# Patient Record
Sex: Female | Born: 1955
Health system: Southern US, Community
[De-identification: ages and names within clinical notes are randomized; demographics above are authoritative.]

## PROBLEM LIST (undated history)

## (undated) DIAGNOSIS — K589 Irritable bowel syndrome without diarrhea: Secondary | ICD-10-CM

## (undated) DIAGNOSIS — K449 Diaphragmatic hernia without obstruction or gangrene: Secondary | ICD-10-CM

## (undated) DIAGNOSIS — Z9189 Other specified personal risk factors, not elsewhere classified: Secondary | ICD-10-CM

## (undated) DIAGNOSIS — C439 Malignant melanoma of skin, unspecified: Secondary | ICD-10-CM

## (undated) DIAGNOSIS — R3129 Other microscopic hematuria: Secondary | ICD-10-CM

## (undated) DIAGNOSIS — K5792 Diverticulitis of intestine, part unspecified, without perforation or abscess without bleeding: Secondary | ICD-10-CM

## (undated) DIAGNOSIS — K5790 Diverticulosis of intestine, part unspecified, without perforation or abscess without bleeding: Secondary | ICD-10-CM

## (undated) HISTORY — PX: TONSILLECTOMY AND ADENOIDECTOMY: SUR1326

## (undated) HISTORY — PX: MELANOMA EXCISION: SHX5266

## (undated) HISTORY — DX: Diverticulosis of intestine, part unspecified, without perforation or abscess without bleeding: K57.90

## (undated) HISTORY — DX: Other specified personal risk factors, not elsewhere classified: Z91.89

## (undated) HISTORY — DX: Diverticulitis of intestine, part unspecified, without perforation or abscess without bleeding: K57.92

## (undated) HISTORY — DX: Other microscopic hematuria: R31.29

## (undated) HISTORY — DX: Diaphragmatic hernia without obstruction or gangrene: K44.9

## (undated) HISTORY — DX: Irritable bowel syndrome without diarrhea: K58.9

## (undated) HISTORY — DX: Malignant melanoma of skin, unspecified: C43.9

---

## 1988-06-16 HISTORY — PX: TUBAL LIGATION: SHX77

## 1997-11-20 ENCOUNTER — Encounter: Payer: Self-pay | Admitting: Gastroenterology

## 1998-01-12 ENCOUNTER — Ambulatory Visit (HOSPITAL_COMMUNITY): Admission: RE | Admit: 1998-01-12 | Discharge: 1998-01-12 | Payer: Self-pay | Admitting: Gastroenterology

## 1998-12-28 ENCOUNTER — Ambulatory Visit (HOSPITAL_COMMUNITY): Admission: RE | Admit: 1998-12-28 | Discharge: 1998-12-28 | Payer: Self-pay | Admitting: Gynecology

## 1998-12-28 ENCOUNTER — Encounter (INDEPENDENT_AMBULATORY_CARE_PROVIDER_SITE_OTHER): Payer: Self-pay | Admitting: Specialist

## 1999-07-12 ENCOUNTER — Ambulatory Visit (HOSPITAL_COMMUNITY): Admission: RE | Admit: 1999-07-12 | Discharge: 1999-07-12 | Payer: Self-pay | Admitting: Gynecology

## 1999-07-12 ENCOUNTER — Encounter: Payer: Self-pay | Admitting: Gynecology

## 1999-09-25 ENCOUNTER — Other Ambulatory Visit: Admission: RE | Admit: 1999-09-25 | Discharge: 1999-09-25 | Payer: Self-pay | Admitting: Gynecology

## 1999-09-26 ENCOUNTER — Other Ambulatory Visit: Admission: RE | Admit: 1999-09-26 | Discharge: 1999-09-26 | Payer: Self-pay | Admitting: Gynecology

## 1999-09-26 ENCOUNTER — Encounter (INDEPENDENT_AMBULATORY_CARE_PROVIDER_SITE_OTHER): Payer: Self-pay

## 1999-10-07 ENCOUNTER — Encounter: Payer: Self-pay | Admitting: Gastroenterology

## 1999-11-25 ENCOUNTER — Encounter: Payer: Self-pay | Admitting: Gastroenterology

## 1999-11-25 ENCOUNTER — Ambulatory Visit (HOSPITAL_COMMUNITY): Admission: RE | Admit: 1999-11-25 | Discharge: 1999-11-25 | Payer: Self-pay | Admitting: Gastroenterology

## 1999-11-29 ENCOUNTER — Encounter: Payer: Self-pay | Admitting: Gastroenterology

## 2000-09-10 ENCOUNTER — Encounter: Payer: Self-pay | Admitting: Gastroenterology

## 2000-10-13 ENCOUNTER — Encounter: Payer: Self-pay | Admitting: Gynecology

## 2000-10-13 ENCOUNTER — Ambulatory Visit (HOSPITAL_COMMUNITY): Admission: RE | Admit: 2000-10-13 | Discharge: 2000-10-13 | Payer: Self-pay | Admitting: Gynecology

## 2000-12-01 ENCOUNTER — Encounter: Payer: Self-pay | Admitting: Gastroenterology

## 2001-01-06 ENCOUNTER — Other Ambulatory Visit: Admission: RE | Admit: 2001-01-06 | Discharge: 2001-01-06 | Payer: Self-pay | Admitting: Gynecology

## 2001-01-13 ENCOUNTER — Other Ambulatory Visit: Admission: RE | Admit: 2001-01-13 | Discharge: 2001-01-13 | Payer: Self-pay | Admitting: Gynecology

## 2001-07-01 ENCOUNTER — Encounter: Payer: Self-pay | Admitting: Gastroenterology

## 2002-08-04 ENCOUNTER — Other Ambulatory Visit: Admission: RE | Admit: 2002-08-04 | Discharge: 2002-08-04 | Payer: Self-pay | Admitting: Obstetrics and Gynecology

## 2003-03-09 ENCOUNTER — Ambulatory Visit (HOSPITAL_COMMUNITY): Admission: RE | Admit: 2003-03-09 | Discharge: 2003-03-09 | Payer: Self-pay | Admitting: Family Medicine

## 2003-03-09 ENCOUNTER — Encounter: Payer: Self-pay | Admitting: Family Medicine

## 2003-12-13 ENCOUNTER — Other Ambulatory Visit: Admission: RE | Admit: 2003-12-13 | Discharge: 2003-12-13 | Payer: Self-pay | Admitting: Obstetrics and Gynecology

## 2004-03-25 ENCOUNTER — Encounter: Payer: Self-pay | Admitting: Gastroenterology

## 2004-10-22 ENCOUNTER — Ambulatory Visit (HOSPITAL_COMMUNITY): Admission: RE | Admit: 2004-10-22 | Discharge: 2004-10-22 | Payer: Self-pay | Admitting: Obstetrics and Gynecology

## 2005-04-08 ENCOUNTER — Other Ambulatory Visit: Admission: RE | Admit: 2005-04-08 | Discharge: 2005-04-08 | Payer: Self-pay | Admitting: Obstetrics and Gynecology

## 2006-12-29 ENCOUNTER — Other Ambulatory Visit: Admission: RE | Admit: 2006-12-29 | Discharge: 2006-12-29 | Payer: Self-pay | Admitting: Obstetrics and Gynecology

## 2006-12-31 ENCOUNTER — Encounter: Admission: RE | Admit: 2006-12-31 | Discharge: 2006-12-31 | Payer: Self-pay | Admitting: Obstetrics and Gynecology

## 2007-01-21 ENCOUNTER — Encounter: Admission: RE | Admit: 2007-01-21 | Discharge: 2007-01-21 | Payer: Self-pay | Admitting: Endocrinology

## 2007-01-27 ENCOUNTER — Ambulatory Visit (HOSPITAL_COMMUNITY): Admission: RE | Admit: 2007-01-27 | Discharge: 2007-01-27 | Payer: Self-pay | Admitting: Obstetrics and Gynecology

## 2007-12-17 ENCOUNTER — Encounter: Payer: Self-pay | Admitting: Gastroenterology

## 2008-01-07 ENCOUNTER — Other Ambulatory Visit: Admission: RE | Admit: 2008-01-07 | Discharge: 2008-01-07 | Payer: Self-pay | Admitting: Obstetrics and Gynecology

## 2009-02-21 ENCOUNTER — Ambulatory Visit (HOSPITAL_COMMUNITY): Admission: RE | Admit: 2009-02-21 | Discharge: 2009-02-21 | Payer: Self-pay | Admitting: Obstetrics and Gynecology

## 2010-04-24 ENCOUNTER — Telehealth (INDEPENDENT_AMBULATORY_CARE_PROVIDER_SITE_OTHER): Payer: Self-pay | Admitting: *Deleted

## 2010-04-24 ENCOUNTER — Encounter (INDEPENDENT_AMBULATORY_CARE_PROVIDER_SITE_OTHER): Payer: Self-pay | Admitting: *Deleted

## 2010-05-30 ENCOUNTER — Encounter (INDEPENDENT_AMBULATORY_CARE_PROVIDER_SITE_OTHER): Payer: Self-pay | Admitting: *Deleted

## 2010-06-03 ENCOUNTER — Ambulatory Visit: Payer: Self-pay | Admitting: Gastroenterology

## 2010-06-18 ENCOUNTER — Telehealth: Payer: Self-pay | Admitting: Gastroenterology

## 2010-06-20 ENCOUNTER — Ambulatory Visit
Admission: RE | Admit: 2010-06-20 | Discharge: 2010-06-20 | Payer: Self-pay | Source: Home / Self Care | Attending: Gastroenterology | Admitting: Gastroenterology

## 2010-06-20 ENCOUNTER — Encounter: Payer: Self-pay | Admitting: Gastroenterology

## 2010-07-06 ENCOUNTER — Encounter: Payer: Self-pay | Admitting: Internal Medicine

## 2010-07-18 NOTE — Procedures (Signed)
Summary: Colonoscopy  Patient: Mary Bowers Note: All result statuses are Final unless otherwise noted.  Tests: (1) Colonoscopy (COL)   COL Colonoscopy           DONE     [facility]     [address]     [place], [place]  [address]           COLONOSCOPY PROCEDURE REPORT           PATIENT:  Mary, Bowers  MR#:  [id number]     BIRTHDATE:  07/30/1955, 54 yrs. old  GENDER:  female     ENDOSCOPIST:  Judie Petit T. Russella Dar, MD, [facility]           PROCEDURE DATE:  06/20/2010     PROCEDURE:  Colonoscopy 19147     ASA CLASS:  Class II     INDICATIONS:  1) surveillance and high-risk screening  2) family     history of colon cancer: father at 16 and sister at 60.     MEDICATIONS:   Fentanyl 100 mcg IV, Versed 10 mg IV     DESCRIPTION OF PROCEDURE:   After the risks benefits and     alternatives of the procedure were thoroughly explained, informed     consent was obtained.  Digital rectal exam was performed and     revealed no abnormalities.   The LB [id number]-[id number] endoscope     was introduced through the anus and advanced to the cecum, which     was identified by both the appendix and ileocecal valve, without     limitations.  The quality of the prep was excellent, using     MoviPrep.  The instrument was then slowly withdrawn as the colon     was fully examined.     <<PROCEDUREIMAGES>>     FINDINGS:  Moderate diverticulosis was found in the sigmoid to     descending colon.  A normal appearing cecum, ileocecal valve, and     appendiceal orifice were identified. The ascending, hepatic     flexure, transverse, splenic flexure, and rectum appeared     unremarkable. Retroflexed views in the rectum revealed no     abnormalities. The time to cecum =  4 minutes. The scope was then     withdrawn (time =  8  min) from the patient and the procedure     completed.           COMPLICATIONS:  None           ENDOSCOPIC IMPRESSION:     1) Moderate diverticulosis in the sigmoid to descending  colon           RECOMMENDATIONS:     1) High fiber diet with liberal fluid intake.     2) Repeat Colonoscopy in 5 years.           Venita Lick. Russella Dar, MD, [facility]           n.     eSIGNED:   Venita Lick. Minoru Chap at 06/20/2010 10:59 AM           Brickey, Okey Dupre, [id number]  Note: An exclamation mark (!) indicates a result that was not dispersed into the flowsheet. Document Creation Date: 06/20/2010 10:59 AM _______________________________________________________________________  (1) Order result status: Final Collection or observation date-time: 06/20/2010 10:54 Requested date-time:  Receipt date-time:  Reported date-time:  Referring Physician:   Ordering Physician: Claudette Head [phone]) Specimen Source:  Source: Launa Grill Order Number:  [id number] Lab site:   Appended Document: Colonoscopy    Clinical Lists Changes  Observations: Added new observation of COLONNXTDUE: 06/2015 (06/20/2010 12:08)

## 2010-07-18 NOTE — Progress Notes (Signed)
  Phone Note Other Incoming   Request: Send information Summary of Call: Patient completed a  medical release to received records from [facility] for her appt with Dr. Russella Dar. Release faxed to [phone] today.

## 2010-07-18 NOTE — Letter (Signed)
Summary: [facility]   Imported By: Sherian Rein 06/28/2010 09:55:33  _____________________________________________________________________  External Attachment:    Type:   Image     Comment:   External Document

## 2010-07-18 NOTE — Letter (Signed)
Summary: Pre Visit Letter Revised  [facility]  [address] [place], [place] [address]   Phone: [phone]  Fax: [phone]        04/24/2010 MRN: [id number]    Mary Bowers [address] [place], [place]  [address]             Procedure Date:  06/20/10   Welcome to the Gastroenterology Division at [facility].    You are scheduled to see a nurse for your pre-procedure visit on 06/03/09 at 9:00 a.m. on the 3rd floor at [facility], [address]  We ask that you try to arrive at our office 15 minutes prior to your appointment time to allow for check-in.  Please take a minute to review the attached form.  If you answer "Yes" to one or more of the questions on the first page, we ask that you call the person listed at your earliest opportunity.  If you answer "No" to all of the questions, please complete the rest of the form and bring it to your appointment.    Your nurse visit will consist of discussing your medical and surgical history, your immediate family medical history, and your medications.   If you are unable to list all of your medications on the form, please bring the medication bottles to your appointment and we will list them.  We will need to be aware of both prescribed and over the counter drugs.  We will need to know exact dosage information as well.    Please be prepared to read and sign documents such as consent forms, a financial agreement, and acknowledgement forms.  If necessary, and with your consent, a friend or relative is welcome to sit-in on the nurse visit with you.  Please bring your insurance card so that we may make a copy of it.  If your insurance requires a referral to see a specialist, please bring your referral form from your primary care physician.  No co-pay is required for this nurse visit.     If you cannot keep your appointment, please call [phone] to cancel or reschedule prior to your appointment date.  This  allows [place] the opportunity to schedule an appointment for another patient in need of care.    Thank you for choosing  Gastroenterology for your medical needs.  We appreciate the opportunity to care for you.  Please visit [place] at our website  to learn more about our practice.  Sincerely, The Gastroenterology Division

## 2010-07-18 NOTE — Letter (Signed)
Summary: [facility]   Imported By: Sherian Rein 06/28/2010 09:58:07  _____________________________________________________________________  External Attachment:    Type:   Image     Comment:   External Document

## 2010-07-18 NOTE — Letter (Signed)
Summary: [facility]   Imported By: Sherian Rein 06/28/2010 09:58:56  _____________________________________________________________________  External Attachment:    Type:   Image     Comment:   External Document

## 2010-07-18 NOTE — Letter (Signed)
Summary: [facility]   Imported By: Sherian Rein 06/28/2010 09:57:18  _____________________________________________________________________  External Attachment:    Type:   Image     Comment:   External Document

## 2010-07-18 NOTE — Letter (Signed)
Summary: Moviprep Instructions  [facility]  [address]   [place], [place] [address]   Phone: [phone]  Fax: [phone]       Mary Bowers    [birth year]    MRN: [id number]        Procedure Day Dorna Bloom: Thursday, 06-20-10     Arrival Time: 9:30 a.m.     Procedure Time: 10:30 a.m.     Location of Procedure:                    x   [facility] (4th Floor)   PREPARATION FOR COLONOSCOPY WITH MOVIPREP   Starting 5 days prior to your procedure 06-15-10 do not eat nuts, seeds, popcorn, corn, beans, peas,  salads, or any raw vegetables.  Do not take any fiber supplements (e.g. Metamucil, Citrucel, and Benefiber).  THE DAY BEFORE YOUR PROCEDURE         DATE: 06-19-10   DAY: Wednesday  1.  Drink clear liquids the entire day-NO SOLID FOOD  2.  Do not drink anything colored red or purple.  Avoid juices with pulp.  No orange juice.  3.  Drink at least 64 oz. (8 glasses) of fluid/clear liquids during the day to prevent dehydration and help the prep work efficiently.  CLEAR LIQUIDS INCLUDE: Water Jello Ice Popsicles Tea (sugar ok, no milk/cream) Powdered fruit flavored drinks Coffee (sugar ok, no milk/cream) Gatorade Juice: apple, white grape, white cranberry  Lemonade Clear bullion, consomm, broth Carbonated beverages (any kind) Strained chicken noodle soup Hard Candy                             4.  In the morning, mix first dose of MoviPrep solution:    Empty 1 Pouch A and 1 Pouch B into the disposable container    Add lukewarm drinking water to the top line of the container. Mix to dissolve    Refrigerate (mixed solution should be used within 24 hrs)  5.  Begin drinking the prep at 5:00 p.m. The MoviPrep container is divided by 4 marks.   Every 15 minutes drink the solution down to the next mark (approximately 8 oz) until the full liter is complete.   6.  Follow completed prep with 16 oz of clear liquid of your choice (Nothing red or  purple).  Continue to drink clear liquids until bedtime.  7.  Before going to bed, mix second dose of MoviPrep solution:    Empty 1 Pouch A and 1 Pouch B into the disposable container    Add lukewarm drinking water to the top line of the container. Mix to dissolve    Refrigerate  THE DAY OF YOUR PROCEDURE      DATE: 06-20-10  DAY: Thursday  Beginning at 5:30 a.m. (5 hours before procedure):         1. Every 15 minutes, drink the solution down to the next mark (approx 8 oz) until the full liter is complete.  2. Follow completed prep with 16 oz. of clear liquid of your choice.    3. You may drink clear liquids until  8:30 a.m.  (2 HOURS BEFORE PROCEDURE).   MEDICATION INSTRUCTIONS  Unless otherwise instructed, you should take regular prescription medications with a small sip of water   as early as possible the morning of your procedure.         OTHER INSTRUCTIONS  You will need a  responsible adult at least 57 years of age to accompany you and drive you home.   This person must remain in the waiting room during your procedure.  Wear loose fitting clothing that is easily removed.  Leave jewelry and other valuables at home.  However, you may wish to bring a book to read or  an iPod/MP3 player to listen to music as you wait for your procedure to start.  Remove all body piercing jewelry and leave at home.  Total time from sign-in until discharge is approximately 2-3 hours.  You should go home directly after your procedure and rest.  You can resume normal activities the  day after your procedure.  The day of your procedure you should not:   Drive   Make legal decisions   Operate machinery   Drink alcohol   Return to work  You will receive specific instructions about eating, activities and medications before you leave.    The above instructions have been reviewed and explained to me by   Ezra Sites RN  June 03, 2010 9:48 AM     I fully understand and can  verbalize these instructions _____________________________ Date _________

## 2010-07-18 NOTE — Progress Notes (Signed)
Summary: Records  Phone Note Call from Patient Call back at Home Phone [phone]   Caller: Patient Call For: Dr. Russella Dar Reason for Call: Talk to Nurse Summary of Call: Pt just spoke with doctor office about her records for colon and they are supposed to fax them, she would like [place] to call her tomorrow if we do not recieve her records Initial call taken by: [place] Johnson,  June 18, 2010 4:14 PM  Follow-up for Phone Call        Notified pt that we have not recieved the records yet. Pt states she called earlier today and they stated they were working on the records and will send them before the end of the day. Pt states she will call again at 4:00pm to find out the status. Told pt we will call again tomorrow and fax the release over again if we needed to. Pt agreed. Follow-up by: Christie Nottingham CMA Duncan Dull),  June 19, 2010 3:31 PM

## 2010-07-18 NOTE — Letter (Signed)
Summary: [facility]   Imported By: Sherian Rein 06/28/2010 10:00:00  _____________________________________________________________________  External Attachment:    Type:   Image     Comment:   External Document

## 2010-07-18 NOTE — Procedures (Signed)
Summary: Colonoscopy/[place]  Colonoscopy/[place]   Imported By: Sherian Rein 06/28/2010 09:50:16  _____________________________________________________________________  External Attachment:    Type:   Image     Comment:   External Document

## 2010-07-18 NOTE — Miscellaneous (Signed)
Summary: [facility]  Clinical Lists Changes  Medications: Added new medication of MOVIPREP 100 GM  SOLR (PEG-KCL-NACL-NASULF-NA ASC-C) As per prep instructions. - Signed Rx of MOVIPREP 100 GM  SOLR (PEG-KCL-NACL-NASULF-NA ASC-C) As per prep instructions.;  #1 x 0;  Signed;  Entered by: Ezra Sites RN;  Authorized by: Meryl Dare MD [facility];  Method used: Electronically to [facility]. [place]. [phone]*, [address] [place], [place], [place]  [address], Ph: [phone] or [phone], Fax: [phone] Observations: Added new observation of NKA: T (06/03/2010 8:57)    Prescriptions: MOVIPREP 100 GM  SOLR (PEG-KCL-NACL-NASULF-NA ASC-C) As per prep instructions.  #1 x 0   Entered by:   Ezra Sites RN   Authorized by:   Meryl Dare MD [facility]   Signed by:   Ezra Sites RN on 06/03/2010   Method used:   Electronically to        [facility]. [address] [phone]* (retail)       [address]       [place], [place]  [address]       Ph: [phone] or [phone]       Fax: [phone]   RxID:   [id number]   Appended Document: [facility] Pt in PV today. Pt  signed release for records from Dr. Kinnie Scales 11/9.  Have not received records.  Signed another release today and given to [facility] for followup.

## 2010-07-18 NOTE — Letter (Signed)
Summary: [facility]   Imported By: Sherian Rein 06/28/2010 09:56:34  _____________________________________________________________________  External Attachment:    Type:   Image     Comment:   External Document

## 2010-07-18 NOTE — Procedures (Signed)
Summary: Colonoscopy/[facility]  [facility]oscopy/[facility]   Imported By: Sherian Rein 06/28/2010 09:54:13  _____________________________________________________________________  External Attachment:    Type:   Image     Comment:   External Document

## 2010-11-20 ENCOUNTER — Other Ambulatory Visit: Payer: Self-pay | Admitting: Obstetrics and Gynecology

## 2010-11-20 DIAGNOSIS — Z1231 Encounter for screening mammogram for malignant neoplasm of breast: Secondary | ICD-10-CM

## 2010-12-03 ENCOUNTER — Ambulatory Visit (HOSPITAL_COMMUNITY)
Admission: RE | Admit: 2010-12-03 | Discharge: 2010-12-03 | Disposition: A | Discharge status: Home / Self Care | Payer: 59 | Source: Ambulatory Visit | Attending: Obstetrics and Gynecology | Admitting: Obstetrics and Gynecology

## 2010-12-03 DIAGNOSIS — Z1231 Encounter for screening mammogram for malignant neoplasm of breast: Secondary | ICD-10-CM

## 2011-01-14 ENCOUNTER — Emergency Department (HOSPITAL_COMMUNITY)
Admission: EM | Admit: 2011-01-14 | Discharge: 2011-01-14 | Disposition: A | Discharge status: Home / Self Care | Payer: Managed Care, Other (non HMO) | Attending: Emergency Medicine | Admitting: Emergency Medicine

## 2011-01-14 DIAGNOSIS — W010XXA Fall on same level from slipping, tripping and stumbling without subsequent striking against object, initial encounter: Secondary | ICD-10-CM | POA: Insufficient documentation

## 2011-01-14 DIAGNOSIS — S01501A Unspecified open wound of lip, initial encounter: Secondary | ICD-10-CM | POA: Insufficient documentation

## 2011-01-14 DIAGNOSIS — R040 Epistaxis: Secondary | ICD-10-CM | POA: Insufficient documentation

## 2011-01-14 DIAGNOSIS — S1093XA Contusion of unspecified part of neck, initial encounter: Secondary | ICD-10-CM | POA: Insufficient documentation

## 2011-01-14 DIAGNOSIS — S0003XA Contusion of scalp, initial encounter: Secondary | ICD-10-CM | POA: Insufficient documentation

## 2011-01-14 DIAGNOSIS — K089 Disorder of teeth and supporting structures, unspecified: Secondary | ICD-10-CM | POA: Insufficient documentation

## 2011-01-14 DIAGNOSIS — R51 Headache: Secondary | ICD-10-CM | POA: Insufficient documentation

## 2013-04-20 ENCOUNTER — Other Ambulatory Visit (HOSPITAL_COMMUNITY): Payer: Self-pay | Admitting: Internal Medicine

## 2013-04-20 DIAGNOSIS — Z1231 Encounter for screening mammogram for malignant neoplasm of breast: Secondary | ICD-10-CM

## 2013-05-05 ENCOUNTER — Ambulatory Visit (HOSPITAL_COMMUNITY)
Admission: RE | Admit: 2013-05-05 | Discharge: 2013-05-05 | Disposition: A | Discharge status: Home / Self Care | Payer: Commercial Managed Care - PPO | Source: Ambulatory Visit | Attending: Internal Medicine | Admitting: Internal Medicine

## 2013-05-05 DIAGNOSIS — Z1231 Encounter for screening mammogram for malignant neoplasm of breast: Secondary | ICD-10-CM

## 2013-05-10 ENCOUNTER — Other Ambulatory Visit: Payer: Self-pay | Admitting: Gynecology

## 2013-05-10 DIAGNOSIS — N644 Mastodynia: Secondary | ICD-10-CM

## 2013-05-11 ENCOUNTER — Encounter: Payer: Self-pay | Admitting: Obstetrics and Gynecology

## 2013-05-11 ENCOUNTER — Other Ambulatory Visit: Payer: Self-pay | Admitting: Gynecology

## 2013-05-11 DIAGNOSIS — N644 Mastodynia: Secondary | ICD-10-CM

## 2013-05-16 ENCOUNTER — Ambulatory Visit (INDEPENDENT_AMBULATORY_CARE_PROVIDER_SITE_OTHER): Payer: Commercial Managed Care - PPO | Admitting: Gynecology

## 2013-05-16 ENCOUNTER — Encounter: Payer: Self-pay | Admitting: Gynecology

## 2013-05-16 VITALS — BP 116/74 | HR 78 | Resp 18 | Ht 67.0 in | Wt 165.0 lb

## 2013-05-16 DIAGNOSIS — Z124 Encounter for screening for malignant neoplasm of cervix: Secondary | ICD-10-CM

## 2013-05-16 DIAGNOSIS — Z01419 Encounter for gynecological examination (general) (routine) without abnormal findings: Secondary | ICD-10-CM

## 2013-05-16 DIAGNOSIS — E041 Nontoxic single thyroid nodule: Secondary | ICD-10-CM | POA: Insufficient documentation

## 2013-05-16 DIAGNOSIS — R3129 Other microscopic hematuria: Secondary | ICD-10-CM | POA: Insufficient documentation

## 2013-05-16 DIAGNOSIS — Z9189 Other specified personal risk factors, not elsewhere classified: Secondary | ICD-10-CM | POA: Insufficient documentation

## 2013-05-16 NOTE — Patient Instructions (Signed)

## 2013-05-16 NOTE — Progress Notes (Signed)
57 y.o. Divorced Caucasian female   G2P2002 here for annual exam. Pt reports menses are absent.  She does not report hot flashes, does not have night sweats, does not have vaginal dryness.  She is not using lubricants.  She does not report post-menopasual bleeding. Pt reports history of DES exposure. Reports recurrent breast tenderness on left is getting diagnostic mammo next week, is having renal u/s for microscopic hematuria, and u/s for nodule on thyroid scheduled  Patient's last menstrual period was 06/16/2005.          Sexually active: no  The current method of family planning is post menopausal status.    Exercising: yes  tennis, gym 7x/wk Last pap: 03/01/10 Neg Abnormal PAP: no Mammogram: 12/04/10, Diagnostic scheduled for next week BSE: yes Colonoscopy: 2012 f/u in 5 years DEXA: none, scheduled for 11/2013 Alcohol: 4 glasses of wine/wk Tobacco: no  Hgb: PCP  Health Maintenance  Topic Date Due  . Colonoscopy  12/26/2003  . Mammogram  12/02/2012  . Influenza Vaccine  01/14/2013  . Pap Smear  03/01/2013  . Tetanus/tdap  06/17/2015    Family History  Problem Relation Age of Onset  . Hypertension Mother   . Colon cancer Father   . Hypertension Sister   . Hypertension Brother   . Breast cancer Maternal Aunt   . Cancer Maternal Grandmother   . Breast cancer Maternal Aunt     There are no active problems to display for this patient.   Past Medical History  Diagnosis Date  . IBS (irritable bowel syndrome)   . Microscopic hematuria 90's    w/u negative     Past Surgical History  Procedure Laterality Date  . Tonsillectomy and adenoidectomy    . Tubal ligation  1990    Allergies: Review of patient's allergies indicates no known allergies.  Current Outpatient Prescriptions  Medication Sig Dispense Refill  . hyoscyamine (LEVSIN, ANASPAZ) 0.125 MG tablet Take 0.125 mg by mouth as needed.      . IBUPROFEN PO Take by mouth as needed.      . Multiple Vitamins-Minerals  (MULTIVITAMIN PO) Take by mouth.       No current facility-administered medications for this visit.    ROS: Pertinent items are noted in HPI.  Exam:    BP 116/74  Pulse 78  Resp 18  Ht 5\' 7"  (1.702 m)  Wt 165 lb (74.844 kg)  BMI 25.84 kg/m2  LMP 06/16/2005 Weight change: @WEIGHTCHANGE @ Last 3 height recordings:  Ht Readings from Last 3 Encounters:  05/16/13 5\' 7"  (1.702 m)   General appearance: alert, cooperative and appears stated age Head: Normocephalic, without obvious abnormality, atraumatic Neck: no adenopathy, no carotid bruit, no JVD, supple, symmetrical, trachea midline and thyroid not enlarged, symmetric, no tenderness/mass/nodules Lungs: clear to auscultation bilaterally Breasts: normal appearance, no masses or tenderness Heart: regular rate and rhythm, S1, S2 normal, no murmur, click, rub or gallop Abdomen: soft, non-tender; bowel sounds normal; no masses,  no organomegaly Extremities: extremities normal, atraumatic, no cyanosis or edema Skin: Skin color, texture, turgor normal. No rashes or lesions Lymph nodes: Cervical, supraclavicular, and axillary nodes normal. no inguinal nodes palpated Neurologic: Grossly normal   Pelvic: External genitalia:  no lesions              Urethra: normal appearing urethra with no masses, tenderness or lesions              Bartholins and Skenes: normal  Vagina: normal appearing vagina with normal color and discharge, no lesions, rectocele noted              Cervix: cockscomb? anterior lip              Pap taken: yes        Bimanual Exam:  Uterus:  uterus is normal size, shape, consistency and nontender                                      Adnexa:    normal adnexa in size, nontender and no masses                                      Rectovaginal: Confirms                                      Anus:  normal sphincter tone, no lesions  A: well woman Mastalgia History of in utero DES Microscopic hematuria Thyroid  nodlue     P: mammogram diagnostic set  pap smear HPV today, regular PAP until 68 due to DES F/u with PCP regarding kidney and thyroid eval counseled on breast self exam, mammography screening, adequate intake of calcium and vitamin D, diet and exercise return annually or prn Discussed PAP guideline changes, importance of weight bearing exercises, calcium, vit D and balanced diet.  An After Visit Summary was printed and given to the patient.

## 2013-05-25 ENCOUNTER — Ambulatory Visit
Admission: RE | Admit: 2013-05-25 | Discharge: 2013-05-25 | Disposition: A | Discharge status: Home / Self Care | Payer: Commercial Managed Care - PPO | Source: Ambulatory Visit | Attending: Gynecology | Admitting: Gynecology

## 2013-05-25 DIAGNOSIS — N644 Mastodynia: Secondary | ICD-10-CM

## 2013-05-26 ENCOUNTER — Telehealth: Payer: Self-pay | Admitting: Emergency Medicine

## 2013-05-26 NOTE — Telephone Encounter (Signed)
Message copied by Joeseph Amor on Thu May 26, 2013 10:52 AM ------      Message from: Douglass Rivers      Created: Wed May 25, 2013 10:08 AM       Inform breast imaging was normal as was our exam, if she continues to have intermittent tenderness in breast, we can reassess in 63m.  If she decides that she can go into recall otherwise, no ------

## 2013-05-26 NOTE — Telephone Encounter (Signed)
Message left to return call to Mary Bowers at [phone].    

## 2013-05-26 NOTE — Telephone Encounter (Signed)
Message left to return call to Krisi Azua at [phone].    

## 2013-05-26 NOTE — Telephone Encounter (Signed)
Patient is returning a call to tracy.

## 2013-06-28 NOTE — Telephone Encounter (Signed)
Message left to return call to Jaeshawn Silvio at [phone].    

## 2013-07-05 NOTE — Telephone Encounter (Signed)
Spoke with pt to advise that her breast imaging was normal. Inquired about any remaining tenderness and rec to be seen in April if so. Pt is having problems with arthritis and is taking a lot of ibuprofen, but no real breast problems now. Advised pt to call back if she has any problems for F/U appt. Pt agreeable.

## 2013-07-11 NOTE — Telephone Encounter (Signed)
Routing to provider for final review. Patient agreeable to disposition. Will close encounter  Recall entered

## 2014-04-17 ENCOUNTER — Encounter: Payer: Self-pay | Admitting: Gynecology

## 2014-05-01 ENCOUNTER — Telehealth: Payer: Self-pay | Admitting: *Deleted

## 2014-05-01 NOTE — Telephone Encounter (Signed)
Patient in 08 recall Last Pap- 05/16/13 Neg but has a history of DES Exposure  No AEX scheduled.  Left Message To Call Back

## 2014-05-18 ENCOUNTER — Other Ambulatory Visit: Payer: Self-pay | Admitting: Internal Medicine

## 2014-05-18 DIAGNOSIS — R319 Hematuria, unspecified: Secondary | ICD-10-CM

## 2014-05-18 DIAGNOSIS — E041 Nontoxic single thyroid nodule: Secondary | ICD-10-CM

## 2014-06-06 NOTE — Telephone Encounter (Signed)
Called patient Mary Bowers states Mary Bowers received message of Dr. Charlies Constable no longer being at our practice and is scheduled with another provider at another practice.

## 2014-06-14 ENCOUNTER — Other Ambulatory Visit: Payer: Commercial Managed Care - PPO

## 2014-06-30 ENCOUNTER — Ambulatory Visit
Admission: RE | Admit: 2014-06-30 | Discharge: 2014-06-30 | Disposition: A | Discharge status: Home / Self Care | Payer: Commercial Managed Care - PPO | Source: Ambulatory Visit | Attending: Internal Medicine | Admitting: Internal Medicine

## 2014-06-30 DIAGNOSIS — E041 Nontoxic single thyroid nodule: Secondary | ICD-10-CM

## 2014-06-30 DIAGNOSIS — R319 Hematuria, unspecified: Secondary | ICD-10-CM

## 2014-07-05 NOTE — Telephone Encounter (Signed)
Pt has transferred care.  Please advise recall/letter.

## 2014-07-06 NOTE — Telephone Encounter (Signed)
Out of Pap recall.  Can close encounter.

## 2014-07-06 NOTE — Telephone Encounter (Signed)
Pt out of recall. Closing encounter.

## 2015-06-21 ENCOUNTER — Encounter: Payer: Self-pay | Admitting: Gastroenterology

## 2015-08-02 ENCOUNTER — Encounter: Payer: Self-pay | Admitting: Gastroenterology

## 2015-08-17 ENCOUNTER — Encounter: Payer: Commercial Managed Care - PPO | Admitting: Gastroenterology

## 2015-09-25 ENCOUNTER — Encounter: Payer: Self-pay | Admitting: Gastroenterology

## 2015-10-05 ENCOUNTER — Encounter: Payer: Commercial Managed Care - PPO | Admitting: Gastroenterology

## 2015-10-16 ENCOUNTER — Encounter: Payer: Self-pay | Admitting: Internal Medicine

## 2017-08-31 ENCOUNTER — Other Ambulatory Visit: Payer: Self-pay | Admitting: Obstetrics and Gynecology

## 2017-08-31 DIAGNOSIS — Z1231 Encounter for screening mammogram for malignant neoplasm of breast: Secondary | ICD-10-CM

## 2017-09-01 ENCOUNTER — Ambulatory Visit: Payer: Commercial Managed Care - PPO

## 2018-06-19 DIAGNOSIS — J019 Acute sinusitis, unspecified: Secondary | ICD-10-CM | POA: Diagnosis not present

## 2018-07-08 DIAGNOSIS — M5136 Other intervertebral disc degeneration, lumbar region: Secondary | ICD-10-CM | POA: Diagnosis not present

## 2018-07-08 DIAGNOSIS — M545 Low back pain: Secondary | ICD-10-CM | POA: Diagnosis not present

## 2018-07-12 DIAGNOSIS — M5416 Radiculopathy, lumbar region: Secondary | ICD-10-CM | POA: Insufficient documentation

## 2018-07-21 DIAGNOSIS — L821 Other seborrheic keratosis: Secondary | ICD-10-CM | POA: Diagnosis not present

## 2018-07-21 DIAGNOSIS — L814 Other melanin hyperpigmentation: Secondary | ICD-10-CM | POA: Diagnosis not present

## 2018-07-21 DIAGNOSIS — D225 Melanocytic nevi of trunk: Secondary | ICD-10-CM | POA: Diagnosis not present

## 2018-07-23 DIAGNOSIS — M5416 Radiculopathy, lumbar region: Secondary | ICD-10-CM | POA: Diagnosis not present

## 2018-08-11 DIAGNOSIS — M5416 Radiculopathy, lumbar region: Secondary | ICD-10-CM | POA: Diagnosis not present

## 2018-08-11 DIAGNOSIS — M5136 Other intervertebral disc degeneration, lumbar region: Secondary | ICD-10-CM | POA: Diagnosis not present

## 2018-08-16 ENCOUNTER — Other Ambulatory Visit: Payer: Self-pay | Admitting: Registered Nurse

## 2018-08-16 ENCOUNTER — Ambulatory Visit
Admission: RE | Admit: 2018-08-16 | Discharge: 2018-08-16 | Disposition: A | Discharge status: Home / Self Care | Payer: Commercial Managed Care - PPO | Source: Ambulatory Visit | Attending: Registered Nurse | Admitting: Registered Nurse

## 2018-08-16 ENCOUNTER — Other Ambulatory Visit: Payer: Self-pay | Admitting: Internal Medicine

## 2018-08-16 DIAGNOSIS — R1033 Periumbilical pain: Secondary | ICD-10-CM | POA: Diagnosis not present

## 2018-08-16 DIAGNOSIS — K449 Diaphragmatic hernia without obstruction or gangrene: Secondary | ICD-10-CM | POA: Diagnosis not present

## 2018-08-16 DIAGNOSIS — Z6826 Body mass index (BMI) 26.0-26.9, adult: Secondary | ICD-10-CM | POA: Diagnosis not present

## 2018-08-16 DIAGNOSIS — K5792 Diverticulitis of intestine, part unspecified, without perforation or abscess without bleeding: Secondary | ICD-10-CM | POA: Diagnosis not present

## 2018-08-16 DIAGNOSIS — N390 Urinary tract infection, site not specified: Secondary | ICD-10-CM | POA: Diagnosis not present

## 2018-08-16 DIAGNOSIS — K5730 Diverticulosis of large intestine without perforation or abscess without bleeding: Secondary | ICD-10-CM | POA: Diagnosis not present

## 2018-08-16 MED ORDER — IOPAMIDOL (ISOVUE-300) INJECTION 61%
100.0000 mL | Freq: Once | INTRAVENOUS | Status: AC | PRN
  Administered 2018-08-16: 100 mL via INTRAVENOUS

## 2018-10-26 DIAGNOSIS — H6691 Otitis media, unspecified, right ear: Secondary | ICD-10-CM | POA: Diagnosis not present

## 2018-10-26 DIAGNOSIS — H9311 Tinnitus, right ear: Secondary | ICD-10-CM | POA: Diagnosis not present

## 2018-10-26 DIAGNOSIS — H9201 Otalgia, right ear: Secondary | ICD-10-CM | POA: Diagnosis not present

## 2019-03-08 ENCOUNTER — Other Ambulatory Visit: Payer: Self-pay | Admitting: Obstetrics and Gynecology

## 2019-03-08 DIAGNOSIS — R928 Other abnormal and inconclusive findings on diagnostic imaging of breast: Secondary | ICD-10-CM

## 2019-03-15 ENCOUNTER — Ambulatory Visit
Admission: RE | Admit: 2019-03-15 | Discharge: 2019-03-15 | Disposition: A | Discharge status: Home / Self Care | Payer: 59 | Source: Ambulatory Visit | Attending: Obstetrics and Gynecology | Admitting: Obstetrics and Gynecology

## 2019-03-15 ENCOUNTER — Other Ambulatory Visit: Payer: Self-pay

## 2019-03-15 DIAGNOSIS — R928 Other abnormal and inconclusive findings on diagnostic imaging of breast: Secondary | ICD-10-CM

## 2019-03-16 ENCOUNTER — Other Ambulatory Visit: Payer: Self-pay

## 2019-03-16 DIAGNOSIS — Z20822 Contact with and (suspected) exposure to covid-19: Secondary | ICD-10-CM

## 2019-03-17 LAB — NOVEL CORONAVIRUS, NAA: SARS-CoV-2, NAA: NOT DETECTED

## 2019-03-23 ENCOUNTER — Other Ambulatory Visit: Payer: Self-pay

## 2019-03-23 DIAGNOSIS — Z20822 Contact with and (suspected) exposure to covid-19: Secondary | ICD-10-CM

## 2019-03-25 LAB — NOVEL CORONAVIRUS, NAA: SARS-CoV-2, NAA: NOT DETECTED

## 2019-07-15 ENCOUNTER — Ambulatory Visit: Payer: 59

## 2019-07-22 ENCOUNTER — Ambulatory Visit: Payer: 59 | Attending: Internal Medicine

## 2019-07-22 DIAGNOSIS — Z23 Encounter for immunization: Secondary | ICD-10-CM | POA: Insufficient documentation

## 2019-07-22 NOTE — Progress Notes (Signed)
   Covid-19 Vaccination Clinic  Name:  Mary Bowers    MRN: [id number] DOB: 1955-06-18  07/22/2019  Mary Bowers was observed post Covid-19 immunization for 15 minutes without incidence. She was provided with Vaccine Information Sheet and instruction to access the V-Safe system.   Mary Bowers was instructed to call 911 with any severe reactions post vaccine: Marland Kitchen Difficulty breathing  . Swelling of your face and throat  . A fast heartbeat  . A bad rash all over your body  . Dizziness and weakness    Immunizations Administered    Name Date Dose VIS Date Route   Pfizer COVID-19 Vaccine 07/22/2019 10:13 AM 0.3 mL 05/27/2019 Intramuscular   Manufacturer: [place]   Lot: [id number]   [place]: [id number]

## 2019-08-05 ENCOUNTER — Ambulatory Visit: Payer: 59

## 2019-08-16 ENCOUNTER — Ambulatory Visit: Payer: 59 | Attending: Internal Medicine

## 2019-08-16 DIAGNOSIS — Z23 Encounter for immunization: Secondary | ICD-10-CM

## 2019-08-16 NOTE — Progress Notes (Signed)
   Covid-19 Vaccination Clinic  Name:  Embry Linwood    MRN: [id number] DOB: March 22, 1956  08/16/2019  Ms. Belgarde was observed post Covid-19 immunization for 15 minutes without incident. She was provided with Vaccine Information Sheet and instruction to access the V-Safe system.   Ms. Ya was instructed to call 911 with any severe reactions post vaccine: Marland Kitchen Difficulty breathing  . Swelling of face and throat  . A fast heartbeat  . A bad rash all over body  . Dizziness and weakness   Immunizations Administered    Name Date Dose VIS Date Route   Pfizer COVID-19 Vaccine 08/16/2019 11:12 AM 0.3 mL 05/27/2019 Intramuscular   Manufacturer: [place]   Lot: [id number]   [place]: [id number]

## 2020-04-16 ENCOUNTER — Other Ambulatory Visit: Payer: Self-pay | Admitting: Internal Medicine

## 2020-04-16 DIAGNOSIS — E041 Nontoxic single thyroid nodule: Secondary | ICD-10-CM

## 2020-04-24 ENCOUNTER — Ambulatory Visit
Admission: RE | Admit: 2020-04-24 | Discharge: 2020-04-24 | Disposition: A | Discharge status: Home / Self Care | Payer: 59 | Source: Ambulatory Visit | Attending: Internal Medicine | Admitting: Internal Medicine

## 2020-04-24 DIAGNOSIS — E041 Nontoxic single thyroid nodule: Secondary | ICD-10-CM

## 2020-09-06 ENCOUNTER — Telehealth: Payer: Self-pay | Admitting: Gastroenterology

## 2020-09-06 NOTE — Telephone Encounter (Signed)
OK per patient preference.  

## 2020-09-06 NOTE — Telephone Encounter (Signed)
Hi Dr. Fuller Plan, this pt saw you back in 2012. She would like to switch to Dr. Tarri Glenn because she feels more comfortable seen a female physician and has also heard good thing about her. Are you ok with the switch? Thank you.

## 2020-09-09 NOTE — Telephone Encounter (Signed)
OK with me.  Thanks. 

## 2020-09-10 NOTE — Telephone Encounter (Signed)
Left msg to c/b

## 2020-10-17 NOTE — Progress Notes (Signed)
Referring Provider: Shon Baton, MD Primary Care Physician:  Shon Baton, MD  Reason for Consultation: Diverticulosis, acid reflux, family history of colon cancer   IMPRESSION:  Intermittent solid-food dysphagia over the last year    - ? GERD-related dysmotility, ring, web, stricture    - EGD recommended for further evaluation Frequent fecal accidents - ? Etiology    - no associated diarrhea or constipation Reflux with history of small hiatal hernia on CT    - intolerant to 2 prior medications prescribed by Dr. Virgina Jock    - incomplete treatment on famotidine 20 mg QHS    - increase famotidine to BID dosing    - EGD for further evaluation Diverticulosis on CT IBS - diagnosed in her 11s Family history of colon cancer (father at 28, sister at 80) Family history of colon polyps (brother) No polyps on prior colonoscopy x 4, last 2012  PLAN: - Increase Famotidine 20 mg to BID - EGD to evaluate reflux and dysphagia - Colonoscopy for colon cancer screening (high risk due to family history) - Consider genetic counseling given the family history  Please see the "Patient Instructions" section for addition details about the plan.  HPI: Mary Bowers is a 65 y.o. female referred by Dr. Virgina Jock for diverticulosis, acid reflux, and family history of colon cancer.  The history is obtained through the patient and review of her electronic health record. Previously followed by Drs. Medhoff and Fuller Plan, seen most recently in 2012 for colonoscopy. She carries a history of IBS previously treated with antispasmodics. She is a Company secretary at [employer].   Presents today with concerns regarding:  (1) Recurrent flares of intermittent LLQ pain that she attributes to diverticulitis/diverticulitis in the setting of IBS. Has required antibiotics in the past x 2. Now, when symptoms are starting she will adjust her diet to minimize a full blown episode. Episodes    (2) GERD. Developed a cough in  08/2018 at the time of a URI however the cough never improved. Concurrent indigestion that she was treating with Rolaids. She started Pepcid 20 mg QHS after not tolerating two prior medications. Some breakthrough symptoms still occur with fried foods, beer, and breads. Intermittent sensation that food sticks at the sternum. The food will ultimately pass if she slows down eating. No evidence for GI bleeding, iron deficiency anemia, anorexia, unexplained weight loss, odynophagia, persistent vomiting.  (3) Family history of colon cancer.  Father with colon cancer at age 49 and sister with colon cancer at age 39.  Brother with colon polyps. No other known family history of colon cancer or polyps. No family history of uterine/endometrial cancer, pancreatic cancer or gastric/stomach cancer.  (4) Fecal urgency with associated accidents where she had to go home to change close. Contents are liquid, solid, and air. Not always diarrhea. No constipation.  No incomplete evacuation. No manual assistance with defecation. No challenges with post-defecation hygiene. Occurring intermittently over the last several years. Somewhat improved since she started taking famotidine and not using as much Rolaids. No blood or mucous in the stool.  She specifically asked about food intolerance.   Prior abdominal imaging: CT of the abdomen and pelvis with contrast 10/18/7320 for periumbilical abdominal pain showed colonic diverticulosis without diverticulitis, aortic atherosclerosis, and a small hiatal hernia.  Prior endoscopic history: -Colonoscopy with Dr. Earlean Shawl 07/29/1989 was normal -Colonoscopy with Dr. Earlean Shawl 01/12/1998 for a family history of colon cancer was normal -Colonoscopy with Dr. Earlean Shawl 03/25/2004 showed moderate sigmoid diverticulosis -  Colonoscopy with Dr. Fuller Plan 06/20/2010 showed moderate left-sided diverticulosis.  Repeat colonoscopy recommended in 5 years.   Past Medical History:  Diagnosis Date  . DES exposure in  utero   . Diverticulitis   . Diverticulosis   . Hiatal hernia   . IBS (irritable bowel syndrome)   . Melanoma ([place])   . Microscopic hematuria 90's   w/u negative     Past Surgical History:  Procedure Laterality Date  . MELANOMA EXCISION    . TONSILLECTOMY AND ADENOIDECTOMY    . TUBAL LIGATION  1990    Current Outpatient Medications  Medication Sig Dispense Refill  . famotidine (PEPCID) 20 MG tablet Take 20 mg by mouth daily.    . hyoscyamine (LEVSIN, ANASPAZ) 0.125 MG tablet Take 0.125 mg by mouth as needed.    . IBUPROFEN PO Take by mouth as needed.    . loratadine (CLARITIN) 10 MG tablet Take 10 mg by mouth daily.    . Multiple Vitamins-Minerals (MULTIVITAMIN PO) Take by mouth.     No current facility-administered medications for this visit.    Allergies as of 10/18/2020  . (No Known Allergies)    Family History  Problem Relation Age of Onset  . Hypertension Mother   . Colon cancer Father   . Hypertension Sister   . Hypertension Brother   . Colon polyps Brother   . Colon polyps Brother   . Colon polyps Brother   . Diverticulosis Sister   . Colon cancer Sister   . Lung cancer Sister   . Colon polyps Sister   . Breast cancer Maternal Aunt   . Cancer Maternal Grandmother        unknown type  . Breast cancer Maternal Aunt   . Ovarian cancer Maternal Aunt   . Melanoma Maternal Uncle        mets to brain     Review of Systems: 12 system ROS is negative except as noted above.   Physical Exam: General:   Alert,  well-nourished, pleasant and cooperative in NAD Head:  Normocephalic and atraumatic. Eyes:  Sclera clear, no icterus.   Conjunctiva pink. Ears:  Normal auditory acuity. Nose:  No deformity, discharge,  or lesions. Mouth:  No deformity or lesions.   Neck:  Supple; no masses or thyromegaly. Lungs:  Clear throughout to auscultation.   No wheezes. Heart:  Regular rate and rhythm; no murmurs. Abdomen:  Soft,nontender, nondistended, normal bowel sounds,  no rebound or guarding. No hepatosplenomegaly.   Rectal:  Deferred  Msk:  Symmetrical. No boney deformities LAD: No inguinal or umbilical LAD Extremities:  No clubbing or edema. Neurologic:  Alert and  oriented x4;  grossly nonfocal Skin:  Intact without significant lesions or rashes. Psych:  Alert and cooperative. Normal mood and affect.   Kayshawn Ozburn L. Tarri Glenn, MD, MPH 10/18/2020, 9:39 AM

## 2020-10-18 ENCOUNTER — Ambulatory Visit: Payer: 59 | Admitting: Gastroenterology

## 2020-10-18 ENCOUNTER — Encounter: Payer: Self-pay | Admitting: Gastroenterology

## 2020-10-18 ENCOUNTER — Other Ambulatory Visit: Payer: Self-pay

## 2020-10-18 VITALS — BP 140/86 | HR 80 | Ht 66.54 in | Wt 172.0 lb

## 2020-10-18 DIAGNOSIS — R1319 Other dysphagia: Secondary | ICD-10-CM | POA: Diagnosis not present

## 2020-10-18 DIAGNOSIS — R1032 Left lower quadrant pain: Secondary | ICD-10-CM | POA: Diagnosis not present

## 2020-10-18 DIAGNOSIS — K219 Gastro-esophageal reflux disease without esophagitis: Secondary | ICD-10-CM | POA: Diagnosis not present

## 2020-10-18 DIAGNOSIS — R159 Full incontinence of feces: Secondary | ICD-10-CM

## 2020-10-18 DIAGNOSIS — R152 Fecal urgency: Secondary | ICD-10-CM

## 2020-10-18 MED ORDER — FAMOTIDINE 20 MG PO TABS: 20.0000 mg | ORAL_TABLET | Freq: Two times a day (BID) | ORAL | 3 refills | Status: DC

## 2020-10-18 NOTE — Addendum Note (Signed)
Addended by: Hardie Pulley, Yolander Goodie J on: 10/18/2020 10:20 AM   Modules accepted: Orders

## 2020-10-18 NOTE — Patient Instructions (Addendum)
It was a pleasure to meet you today. Based on our discussion, I am providing you with my recommendations below:  RECOMMENDATION(S):   To better evaluate your symptoms, I am recommending an Endoscopy and a Colonoscopy  I am recommending that we increase your Famotidine. Please refer to the instructions below:  PRESCRIPTION MEDICATION(S):   We have sent the following medication(s) to your pharmacy:  . Famotidine - please take 20mg  by mouth 2 times daily  NOTE: If your medication(s) requires a PRIOR AUTHORIZATION, we will receive notification from your pharmacy. Once received, the process to submit for approval may take up to 7-10 business days. You will be contacted about any denials we have received from your insurance company as well as alternatives recommended by your provider.  COLONOSCOPY AND ENDOSCOPY:   . You have been scheduled for an endoscopy and a colonoscopy. Please follow the written instructions given to you at your visit today.  PREP:   . Please pick up your prep supplies at the pharmacy within the next 1-3 days.  INHALERS:   . If you use inhalers (even only as needed), please bring them with you on the day of your procedure.  COLONOSCOPY TIPS:  . To reduce nausea and dehydration, stay well hydrated for 3-4 days prior to the exam.  . To prevent skin/hemorrhoid irritation - prior to wiping, put A&Dointment or vaseline on the toilet paper. Marland Kitchen Keep a towel or pad on the bed.  Marland Kitchen BEFORE STARTING YOUR PREP, drink  64oz of clear liquids in the morning. This will help to flush the colon and will ensure you are well hydrated!!!!  NOTE - This is in addition to the fluids required for to complete your prep. . Use of a flavored hard candy, such as grape Anise Salvo, can counteract some of the flavor of the prep and may prevent some nausea.   FOLLOW UP:  . After your procedure, you will receive a call from my office staff regarding my recommendation for follow  up.  BMI:  . If you are age 2 or older, your body mass index should be between 23-30. Your Body mass index is 27.32 kg/m. If this is out of the aforementioned range listed, please consider follow up with your Primary Care Provider.  Thank you for trusting me with your gastrointestinal care!    Thornton Park, MD, MPH

## 2021-01-03 ENCOUNTER — Telehealth: Payer: Self-pay | Admitting: Gastroenterology

## 2021-01-03 NOTE — Telephone Encounter (Signed)
Inbound call from patient she is currently having a diverticulitis flare-up and is wanting to know if she needs to be seen before her scheduled procedure on 01/09/21.  Please advise.

## 2021-01-03 NOTE — Telephone Encounter (Signed)
Spoke with pt and she states she started having LLQ abd pain yesterday and had temp 99.7 last night, did not sleep well due to the pain. Her PCP Has just sent in augmentin for her to take. Preocedures rescheduled to 02/14/21@1 :30pm. Pt aware of appt and updated instructions sent through mychart to pt.

## 2021-01-08 IMAGING — CT CT ABD-PELV W/ CM
2 of 5 series · 15 of 46 positions shown, 17 images · IV contrast (iopamidol)
Comparison: None.

CLINICAL DATA: 62-year-old female with history of irritable bowel
syndrome. Periumbilical pain for the past 2 days.

EXAM:
CT ABDOMEN AND PELVIS WITH CONTRAST
TECHNIQUE: Multidetector CT imaging of the abdomen and pelvis was performed
using the standard protocol following bolus administration of
intravenous contrast.
CONTRAST:  100mL EQKPE5-1JJ IOPAMIDOL (EQKPE5-1JJ) INJECTION 61%

[Series 2: abd pelvis 5.00 br40 s3 ax · axial · 0.78mm/px · z∈[+1179,+1584]mm · 12 of 93 slices shown, 14 images]
[im 6/93  soft-tissue]
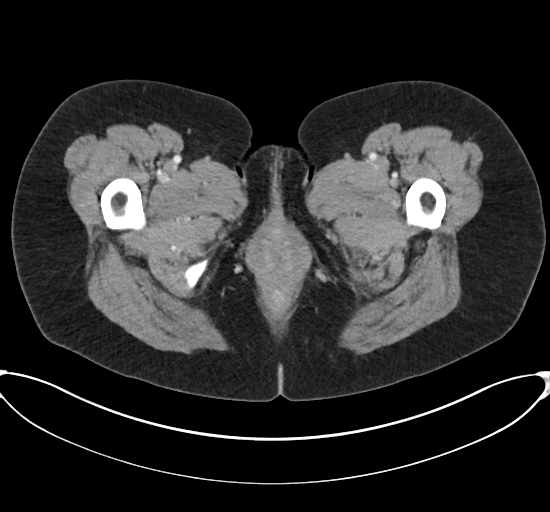
[im 6/93  bone]
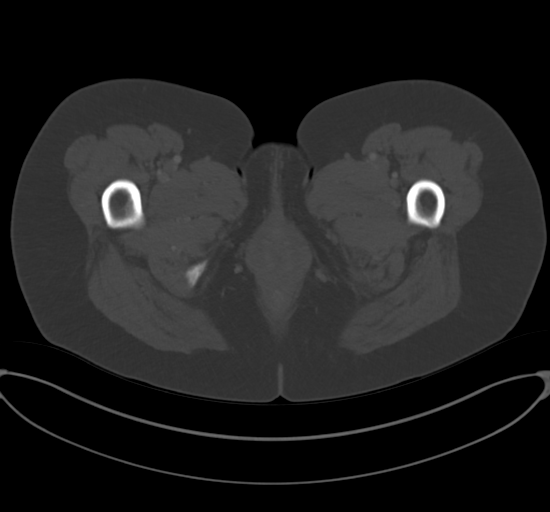
[im 16/93  soft-tissue]
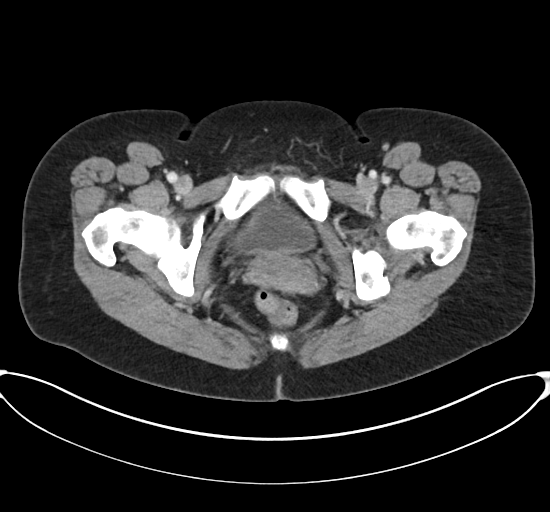
[im 21/93  soft-tissue]
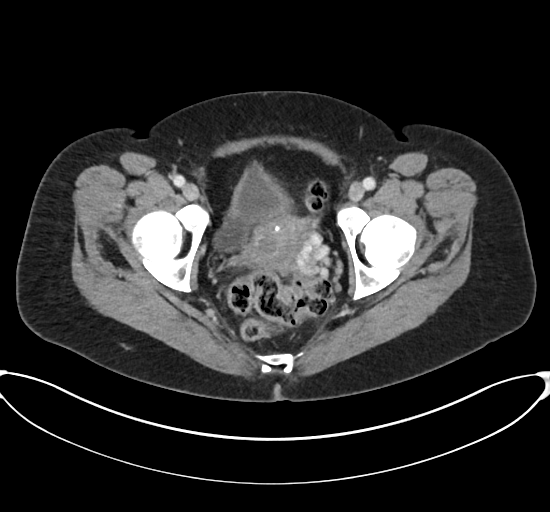
[im 26/93  soft-tissue]
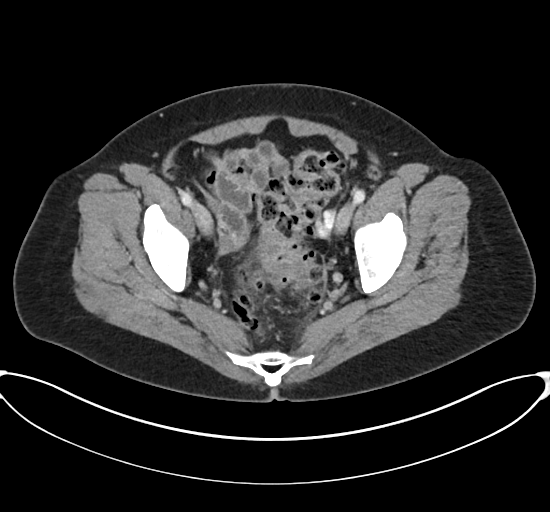
[im 36/93  soft-tissue]
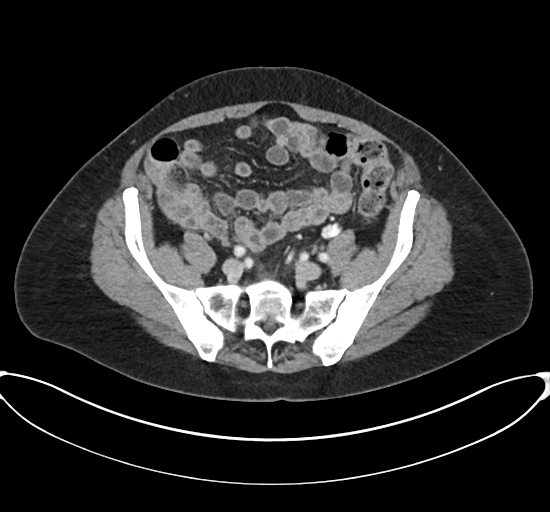
[im 41/93  soft-tissue]
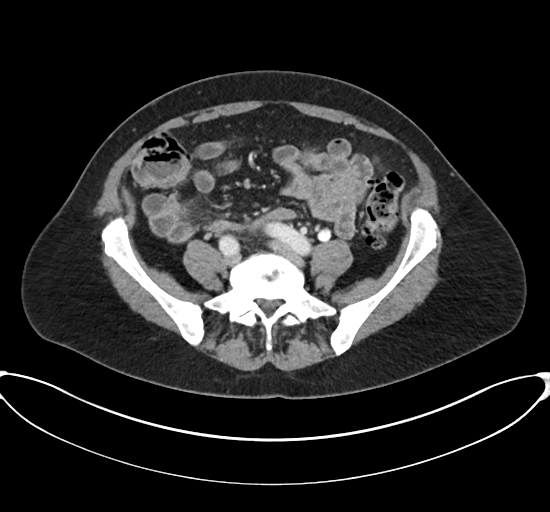
[im 52/93  soft-tissue]
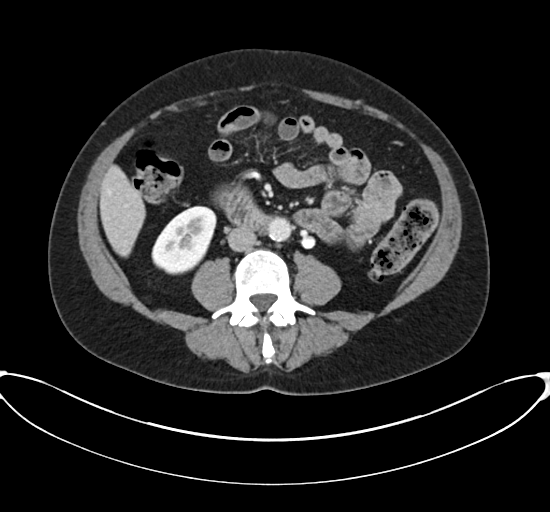
[im 57/93  soft-tissue]
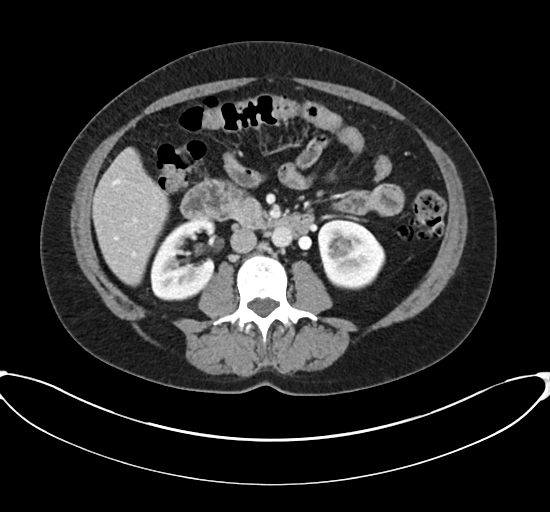
[im 67/93  soft-tissue]
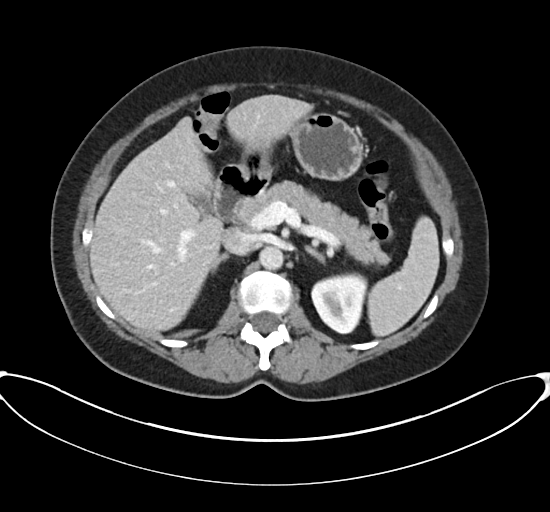
[im 67/93  bone]
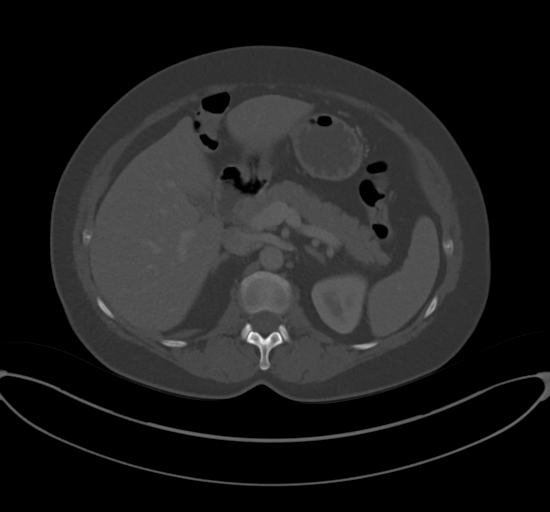
[im 72/93  soft-tissue]
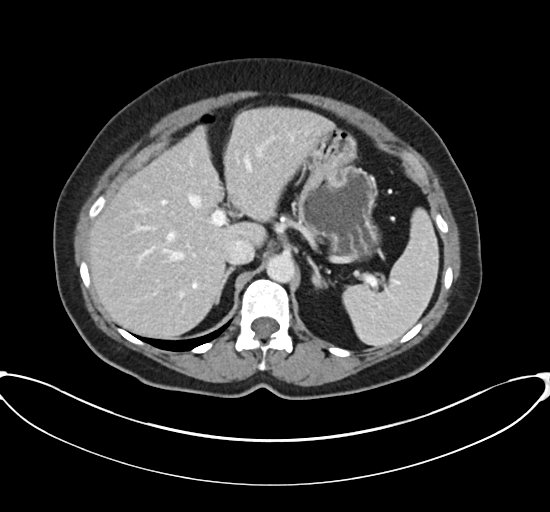
[im 77/93  soft-tissue]
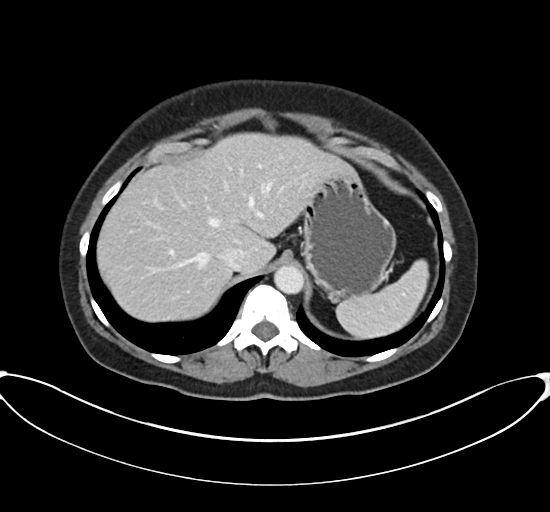
[im 87/93  soft-tissue]
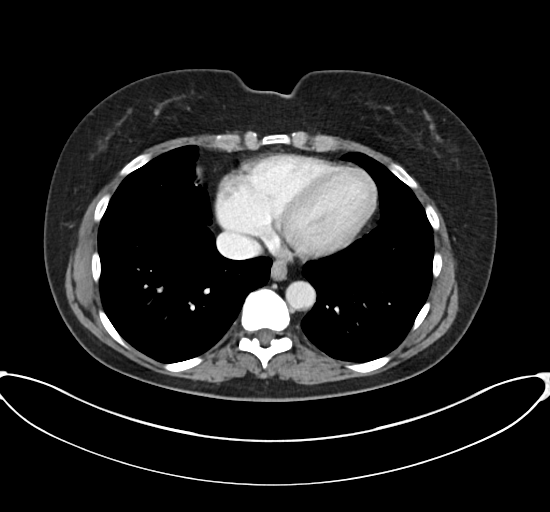

[Series 6: abd pelvis 2.00 br40 s3 cor · coronal · 0.82mm/px · 3 of 142 slices shown]
[im 48/142  soft-tissue]
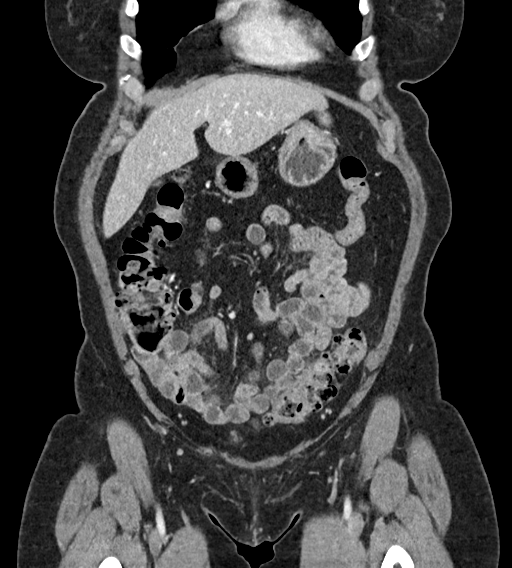
[im 63/142  soft-tissue]
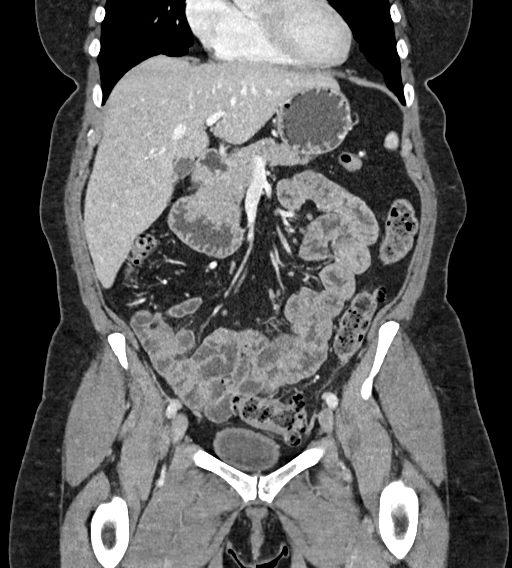
[im 79/142  soft-tissue]
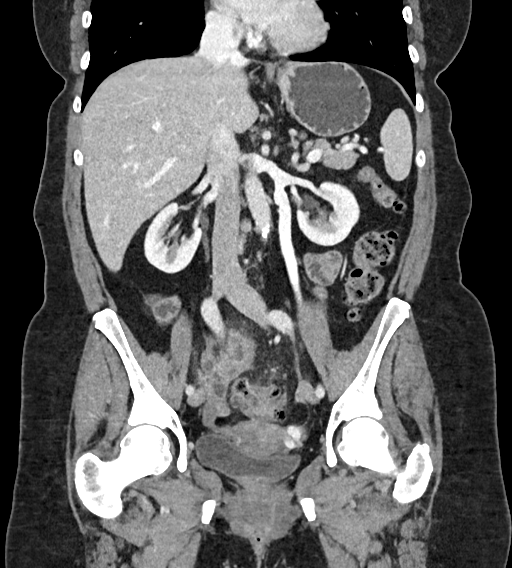

[15 of 46 positions shown; findings below may reference images not displayed]

FINDINGS: Lower chest: Small hiatal hernia.

Hepatobiliary: No suspicious cystic or solid hepatic lesions. No
intra or extrahepatic biliary ductal dilatation. Gallbladder is
normal in appearance.

Pancreas: No pancreatic mass. No pancreatic ductal dilatation. No
pancreatic or peripancreatic fluid or inflammatory changes.

Spleen: Unremarkable.

Adrenals/Urinary Tract: Bilateral kidneys and adrenal glands are
normal in appearance. No hydroureteronephrosis. Urinary bladder is
normal in appearance.

Stomach/Bowel: Normal appearance of the stomach. No pathologic
dilatation of small bowel or colon. Numerous colonic diverticulae
are noted. In the mid sigmoid colon there is extensive inflammatory
changes adjacent to a large diverticulum, compatible with acute
diverticulitis. Mild mural thickening throughout the adjacent colon,
likely related to acute inflammation. No discrete diverticular
abscess identified at this time. The appendix is not confidently
identified and may be surgically absent. Regardless, there are no
inflammatory changes noted adjacent to the cecum to suggest the
presence of an acute appendicitis at this time.

Vascular/Lymphatic: Aortic atherosclerosis, without evidence of
aneurysm or dissection in the abdominal or pelvic vasculature. No
lymphadenopathy noted in the abdomen or pelvis.

Reproductive: Uterus and ovaries are unremarkable in appearance.

Other: No significant volume of ascites.  No pneumoperitoneum.

Musculoskeletal: There are no aggressive appearing lytic or blastic
lesions noted in the visualized portions of the skeleton.
IMPRESSION: 1. Colonic diverticulosis, with acute diverticulitis of the mid
sigmoid colon. No discrete diverticular abscess or findings of frank
perforation are noted at this time.
2. Aortic atherosclerosis.
3. Small hiatal hernia.

These results will be called to the ordering clinician or
representative by the Radiologist Assistant, and communication
documented in the PACS or zVision Dashboard.

## 2021-01-09 ENCOUNTER — Encounter: Payer: 59 | Admitting: Gastroenterology

## 2021-02-15 ENCOUNTER — Other Ambulatory Visit: Payer: Self-pay

## 2021-02-15 ENCOUNTER — Ambulatory Visit (AMBULATORY_SURGERY_CENTER): Payer: 59 | Admitting: Gastroenterology

## 2021-02-15 ENCOUNTER — Encounter: Payer: Self-pay | Admitting: Gastroenterology

## 2021-02-15 VITALS — BP 151/69 | HR 86 | Temp 98.0°F | Resp 18 | Ht 66.0 in | Wt 172.0 lb

## 2021-02-15 DIAGNOSIS — Z1211 Encounter for screening for malignant neoplasm of colon: Secondary | ICD-10-CM

## 2021-02-15 DIAGNOSIS — Z800 Family history of malignant neoplasm of digestive organs: Secondary | ICD-10-CM

## 2021-02-15 DIAGNOSIS — R159 Full incontinence of feces: Secondary | ICD-10-CM

## 2021-02-15 DIAGNOSIS — K222 Esophageal obstruction: Secondary | ICD-10-CM

## 2021-02-15 DIAGNOSIS — R1319 Other dysphagia: Secondary | ICD-10-CM

## 2021-02-15 DIAGNOSIS — K219 Gastro-esophageal reflux disease without esophagitis: Secondary | ICD-10-CM | POA: Diagnosis not present

## 2021-02-15 DIAGNOSIS — K449 Diaphragmatic hernia without obstruction or gangrene: Secondary | ICD-10-CM | POA: Diagnosis not present

## 2021-02-15 MED ORDER — SODIUM CHLORIDE 0.9 % IV SOLN: 500.0000 mL | Freq: Once | INTRAVENOUS | Status: DC

## 2021-02-15 NOTE — Progress Notes (Signed)
Pt's states no medical or surgical changes since previsit or office visit. VS assessed by N.C ?

## 2021-02-15 NOTE — Progress Notes (Signed)
Called to room to assist during endoscopic procedure.  Patient ID and intended procedure confirmed with present staff. Received instructions for my participation in the procedure from the performing physician.  

## 2021-02-15 NOTE — Op Note (Signed)
[facility] Patient Name: Mary Bowers Procedure Date: 02/15/2021 1:18 PM MRN: [id number] Endoscopist: Thornton Park MD, MD Age: 65 Referring MD:  Date of Birth: 03-25-1956 Gender: Female Account #: [id number] Procedure:                Upper GI endoscopy Indications:              Dysphagia, Esophageal reflux symptoms that persist                            despite appropriate therapy Medicines:                Monitored Anesthesia Care Procedure:                Pre-Anesthesia Assessment:                           - Prior to the procedure, a History and Physical                            was performed, and patient medications and                            allergies were reviewed. The patient's tolerance of                            previous anesthesia was also reviewed. The risks                            and benefits of the procedure and the sedation                            options and risks were discussed with the patient.                            All questions were answered, and informed consent                            was obtained. Prior Anticoagulants: The patient has                            taken no previous anticoagulant or antiplatelet                            agents. ASA Grade Assessment: II - A patient with                            mild systemic disease. After reviewing the risks                            and benefits, the patient was deemed in                            satisfactory condition to undergo the procedure.  After obtaining informed consent, the endoscope was                            passed under direct vision. Throughout the                            procedure, the patient's blood pressure, pulse, and                            oxygen saturations were monitored continuously. The                            GIF [id number] was introduced through the                            mouth, and advanced to the  third part of duodenum.                            The upper GI endoscopy was accomplished without                            difficulty. The patient tolerated the procedure                            well. Scope In: Scope Out: Findings:                 A non-obstructing Schatzki ring was found in the                            lower third of the esophagus. A TTS dilator was                            passed through the scope. Dilation with a 16-17-18                            mm balloon dilator was performed to 18 mm. The                            dilation site was examined and showed mild mucosal                            disruption.                           A small hiatal hernia was present.                           The entire examined stomach was normal.                           The examined duodenum was normal. Complications:            No immediate complications. Estimated blood loss:  Minimal. Estimated Blood Loss:     Estimated blood loss was minimal. Impression:               - Non-obstructing Schatzki ring. Dilated.                           - Small hiatal hernia.                           - Normal stomach.                           - Normal examined duodenum.                           - No specimens collected. Recommendation:           - Patient has a contact number available for                            emergencies. The signs and symptoms of potential                            delayed complications were discussed with the                            patient. Return to normal activities tomorrow.                            Written discharge instructions were provided to the                            patient.                           - Clear liquid diet today. Advance diet tomorrow as                            tolerated.                           - Continue present medications. Increase Pepcid to                            20 mg twice  daily.                           - Consider repeat EGD with dilation if symptoms                            recur. Thornton Park MD, MD 02/15/2021 2:05:42 PM This report has been signed electronically.

## 2021-02-15 NOTE — Op Note (Signed)
[facility] Patient Name: Mary Bowers Procedure Date: 02/15/2021 1:17 PM MRN: [id number] Endoscopist: Thornton Park MD, MD Age: 65 Referring MD:  Date of Birth: 1955/08/16 Gender: Female Account #: [id number] Procedure:                Colonoscopy Indications:              Screening patient at increased risk: Family history                            of colorectal cancer in multiple 1st-degree                            relatives                           Family history of colon cancer (father at 50,                            sister at 34)                           Family history of colon polyps (brother)                           -Colonoscopy with Dr. Earlean Shawl 07/29/1989 was normal                           -Colonoscopy with Dr. Earlean Shawl 01/12/1998 for a family                            history of colon cancer was normal                           -Colonoscopy with Dr. Earlean Shawl 03/25/2004 showed                            moderate sigmoid diverticulosis                           -Colonoscopy with Dr. Fuller Plan 06/20/2010 showed                            moderate left-sided diverticulosis. Repeat                            colonoscopy recommended in 5 years. Medicines:                Monitored Anesthesia Care Procedure:                Pre-Anesthesia Assessment:                           - Prior to the procedure, a History and Physical                            was performed, and patient medications and  allergies were reviewed. The patient's tolerance of                            previous anesthesia was also reviewed. The risks                            and benefits of the procedure and the sedation                            options and risks were discussed with the patient.                            All questions were answered, and informed consent                            was obtained. Prior Anticoagulants: The patient has                             taken no previous anticoagulant or antiplatelet                            agents. ASA Grade Assessment: II - A patient with                            mild systemic disease. After reviewing the risks                            and benefits, the patient was deemed in                            satisfactory condition to undergo the procedure.                           After obtaining informed consent, the colonoscope                            was passed under direct vision. Throughout the                            procedure, the patient's blood pressure, pulse, and                            oxygen saturations were monitored continuously. The                            Colonoscope was introduced through the anus and                            advanced to the 3 cm into the ileum. A second                            forward view of the right colon was performed. The  colonoscopy was performed without difficulty. The                            patient tolerated the procedure well. The quality                            of the bowel preparation was good. The terminal                            ileum, ileocecal valve, appendiceal orifice, and                            rectum were photographed. Scope In: 1:40:08 PM Scope Out: 1:54:36 PM Scope Withdrawal Time: 0 hours 9 minutes 56 seconds  Total Procedure Duration: 0 hours 14 minutes 28 seconds  Findings:                 The perianal and digital rectal examinations were                            normal.                           Multiple small and large-mouthed diverticula were                            found in the entire colon, however, they were most                            dense in the sigmoid colon.                           The exam was otherwise without abnormality on                            direct and retroflexion views. Complications:            No immediate complications. Estimated Blood Loss:      Estimated blood loss: none. Impression:               - Diverticulosis in the entire examined colon.                           - The examination was otherwise normal on direct                            and retroflexion views.                           - No specimens collected. Recommendation:           - Patient has a contact number available for                            emergencies. The signs and symptoms of potential  delayed complications were discussed with the                            patient. Return to normal activities tomorrow.                            Written discharge instructions were provided to the                            patient.                           - Follow a high fiber diet. Drink at least 64                            ounces of water daily. Add a daily stool bulking                            agent such as psyllium (an exampled would be                            Metamucil).                           - Continue present medications.                           - Repeat colonoscopy in 5 years for surveillance                            given the family history of colon cancer and polyps.                           - Emerging evidence supports eating a diet of                            fruits, vegetables, grains, calcium, and yogurt                            while reducing red meat and alcohol may reduce the                            risk of colon cancer.                           - Thank you for allowing me to be involved in your                            colon cancer prevention. Thornton Park MD, MD 02/15/2021 2:09:12 PM This report has been signed electronically.

## 2021-02-15 NOTE — Progress Notes (Signed)
A and O x3. Report to RN. Tolerated MAC anesthesia well. 

## 2021-02-15 NOTE — Patient Instructions (Signed)
Thank you for allowing [place] to care for you today! Please follow clear liquid diet for the rest of today, advance to soft foods tomorrow, then regular if well tolerated. Continue present medications.  Increase Pepcid to 20 mg TWICE daily. Recommend transitioning to a high fiber diet and drink at least 64 ounces of water per day.  Add a stool bulking agent such as Metamucil or Benefiber. Recommend another colonoscopy in 5 years given family history.  YOU HAD AN ENDOSCOPIC PROCEDURE TODAY AT [facility]:   Refer to the procedure report that was given to you for any specific questions about what was found during the examination.  If the procedure report does not answer your questions, please call your gastroenterologist to clarify.  If you requested that your care partner not be given the details of your procedure findings, then the procedure report has been included in a sealed envelope for you to review at your convenience later.  YOU SHOULD EXPECT: Some feelings of bloating in the abdomen. Passage of more gas than usual.  Walking can help get rid of the air that was put into your GI tract during the procedure and reduce the bloating. If you had a lower endoscopy (such as a colonoscopy or flexible sigmoidoscopy) you may notice spotting of blood in your stool or on the toilet paper. If you underwent a bowel prep for your procedure, you may not have a normal bowel movement for a few days.  Please Note:  You might notice some irritation and congestion in your nose or some drainage.  This is from the oxygen used during your procedure.  There is no need for concern and it should clear up in a day or so.  SYMPTOMS TO REPORT IMMEDIATELY:  Following lower endoscopy (colonoscopy or flexible sigmoidoscopy):  Excessive amounts of blood in the stool  Significant tenderness or worsening of abdominal pains  Swelling of the abdomen that is new, acute  Fever of 100F or higher  Following upper  endoscopy (EGD)  Vomiting of blood or coffee ground material  New chest pain or pain under the shoulder blades  Painful or persistently difficult swallowing  New shortness of breath  Fever of 100F or higher  Black, tarry-looking stools  For urgent or emergent issues, a gastroenterologist can be reached at any hour by calling [phone]. Do not use MyChart messaging for urgent concerns.    DIET:  We do recommend a small meal at first, but then you may proceed to your regular diet.  Drink plenty of fluids but you should avoid alcoholic beverages for 24 hours.  ACTIVITY:  You should plan to take it easy for the rest of today and you should NOT DRIVE or use heavy machinery until tomorrow (because of the sedation medicines used during the test).    FOLLOW UP: Our staff will call the number listed on your records 48-72 hours following your procedure to check on you and address any questions or concerns that you may have regarding the information given to you following your procedure. If we do not reach you, we will leave a message.  We will attempt to reach you two times.  During this call, we will ask if you have developed any symptoms of COVID 19. If you develop any symptoms (ie: fever, flu-like symptoms, shortness of breath, cough etc.) before then, please call [phone].  If you test positive for Covid 19 in the 2 weeks post procedure, please call and report this  information to [place].    If any biopsies were taken you will be contacted by phone or by letter within the next 1-3 weeks.  Please call us at [phone] if you have not heard about the biopsies in 3 weeks.    SIGNATURES/CONFIDENTIALITY: You and/or your care partner have signed paperwork which will be entered into your electronic medical record.  These signatures attest to the fact that that the information above on your After Visit Summary has been reviewed and is understood.  Full responsibility of the confidentiality of this  discharge information lies with you and/or your care-partner.

## 2021-02-15 NOTE — Progress Notes (Signed)
Referring Provider: Shon Baton, MD Primary Care Physician:  Shon Baton, MD  Reason for Procedure: Diverticulosis, acid reflux, family history of colon cancer   IMPRESSION:  Intermittent solid-food dysphagia over the last year    - ? GERD-related dysmotility, ring, web, stricture    - EGD recommended for further evaluation Frequent fecal accidents - ? Etiology    - no associated diarrhea or constipation Reflux with history of small hiatal hernia on CT    - intolerant to 2 prior medications prescribed by Dr. Virgina Jock    - incomplete treatment on famotidine 20 mg QHS    - increase famotidine to BID dosing    - EGD for further evaluation Diverticulosis on CT IBS - diagnosed in her 17s Family history of colon cancer (father at 41, sister at 69) Family history of colon polyps (brother) No polyps on prior colonoscopy x 4, last 2012  PLAN: - EGD to evaluate reflux and dysphagia - Colonoscopy for colon cancer screening (high risk due to family history)  HPI: Mary Bowers is a 65 y.o. female referred by Dr. Virgina Jock for diverticulosis, acid reflux, and family history of colon cancer.  The history is obtained through the patient and review of her electronic health record. Previously followed by Drs. Medhoff and Fuller Plan, seen most recently in 2012 for colonoscopy. She carries a history of IBS previously treated with antispasmodics. She is a Company secretary at [employer].   Recent concerns:  (1) Recurrent flares of intermittent LLQ pain that she attributes to diverticulitis/diverticulitis in the setting of IBS. Has required antibiotics in the past x 2. Now, when symptoms are starting she will adjust her diet to minimize a full blown episode. Episodes    (2) GERD. Developed a cough in 08/2018 at the time of a URI however the cough never improved. Concurrent indigestion that she was treating with Rolaids. She started Pepcid 20 mg QHS after not tolerating two prior medications. Some  breakthrough symptoms still occur with fried foods, beer, and breads. Intermittent sensation that food sticks at the sternum. The food will ultimately pass if she slows down eating. No evidence for GI bleeding, iron deficiency anemia, anorexia, unexplained weight loss, odynophagia, persistent vomiting.  (3) Family history of colon cancer.  Father with colon cancer at age 63 and sister with colon cancer at age 75.  Brother with colon polyps. No other known family history of colon cancer or polyps. No family history of uterine/endometrial cancer, pancreatic cancer or gastric/stomach cancer.  (4) Fecal urgency with associated accidents where she had to go home to change close. Contents are liquid, solid, and air. Not always diarrhea. No constipation.  No incomplete evacuation. No manual assistance with defecation. No challenges with post-defecation hygiene. Occurring intermittently over the last several years. Somewhat improved since she started taking famotidine and not using as much Rolaids. No blood or mucous in the stool.  She specifically asked about food intolerance.   Prior abdominal imaging: CT of the abdomen and pelvis with contrast [id number] for periumbilical abdominal pain showed colonic diverticulosis without diverticulitis, aortic atherosclerosis, and a small hiatal hernia.  Prior endoscopic history: -Colonoscopy with Dr. Earlean Shawl 07/29/1989 was normal -Colonoscopy with Dr. Earlean Shawl 01/12/1998 for a family history of colon cancer was normal -Colonoscopy with Dr. Earlean Shawl 03/25/2004 showed moderate sigmoid diverticulosis -Colonoscopy with Dr. Fuller Plan 06/20/2010 showed moderate left-sided diverticulosis.  Repeat colonoscopy recommended in 5 years.   Past Medical History:  Diagnosis Date   DES exposure in utero  Diverticulitis    Diverticulosis    Hiatal hernia    IBS (irritable bowel syndrome)    Melanoma (HCC)    Microscopic hematuria 90's   w/u negative     Past Surgical History:   Procedure Laterality Date   MELANOMA EXCISION     TONSILLECTOMY AND ADENOIDECTOMY     TUBAL LIGATION  1990    Current Outpatient Medications  Medication Sig Dispense Refill   famotidine (PEPCID) 20 MG tablet Take 1 tablet (20 mg total) by mouth 2 (two) times daily. 60 tablet 3   IBUPROFEN PO Take by mouth as needed.     hyoscyamine (LEVSIN, ANASPAZ) 0.125 MG tablet Take 0.125 mg by mouth as needed. (Patient not taking: Reported on 02/15/2021)     loratadine (CLARITIN) 10 MG tablet Take 10 mg by mouth daily. (Patient not taking: Reported on 02/15/2021)     Multiple Vitamins-Minerals (MULTIVITAMIN PO) Take by mouth. (Patient not taking: Reported on 02/15/2021)     Current Facility-Administered Medications  Medication Dose Route Frequency Provider Last Rate Last Admin   0.9 %  sodium chloride infusion  500 mL Intravenous Once Thornton Park, MD        Allergies as of 02/15/2021   (No Known Allergies)    Family History  Problem Relation Age of Onset   Hypertension Mother    Colon cancer Father    Hypertension Sister    Hypertension Brother    Colon polyps Brother    Colon polyps Brother    Colon polyps Brother    Diverticulosis Sister    Colon cancer Sister    Lung cancer Sister    Colon polyps Sister    Breast cancer Maternal Aunt    Cancer Maternal Grandmother        unknown type   Breast cancer Maternal Aunt    Ovarian cancer Maternal Aunt    Melanoma Maternal Uncle        mets to brain     Physical Exam: General:   Alert,  well-nourished, pleasant and cooperative in NAD Head:  Normocephalic and atraumatic. Eyes:  Sclera clear, no icterus.   Conjunctiva pink. Ears:  Normal auditory acuity. Nose:  No deformity, discharge,  or lesions. Mouth:  No deformity or lesions.   Neck:  Supple; no masses or thyromegaly. Lungs:  Clear throughout to auscultation.   No wheezes. Heart:  Regular rate and rhythm; no murmurs. Abdomen:  Soft,nontender, nondistended, normal bowel  sounds, no rebound or guarding. No hepatosplenomegaly.   Rectal:  Deferred  Msk:  Symmetrical. No boney deformities LAD: No inguinal or umbilical LAD Extremities:  No clubbing or edema. Neurologic:  Alert and  oriented x4;  grossly nonfocal Skin:  Intact without significant lesions or rashes. Psych:  Alert and cooperative. Normal mood and affect.   Jakhi Dishman L. Tarri Glenn, MD, MPH 02/15/2021, 1:23 PM

## 2021-02-19 ENCOUNTER — Telehealth: Payer: Self-pay | Admitting: *Deleted

## 2021-02-19 NOTE — Telephone Encounter (Signed)
Have you developed a fever since your procedure? no  2.   Have you had an respiratory symptoms (SOB or cough) since your procedure? no  3.   Have you tested positive for COVID 19 since your procedure no  4.   Have you had any family members/close contacts diagnosed with the COVID 19 since your procedure?  no   If yes to any of these questions please route to Joylene John, RN and Joella Prince, RN  Follow up Call-  Call back number 02/15/2021  Post procedure Call Back phone  # [phone]  Permission to leave phone message Yes  Some recent data might be hidden     Patient questions:  Do you have a fever, pain , or abdominal swelling? No. Pain Score  0 *  Have you tolerated food without any problems? Yes.    Have you been able to return to your normal activities? Yes.    Do you have any questions about your discharge instructions: Diet   No. Medications  No. Follow up visit  No.  Do you have questions or concerns about your Care? No.  Actions: * If pain score is 4 or above: No action needed, pain <4.

## 2021-02-22 ENCOUNTER — Other Ambulatory Visit: Payer: Self-pay | Admitting: Gastroenterology

## 2021-02-22 DIAGNOSIS — R1032 Left lower quadrant pain: Secondary | ICD-10-CM

## 2021-02-22 DIAGNOSIS — K219 Gastro-esophageal reflux disease without esophagitis: Secondary | ICD-10-CM

## 2021-02-22 DIAGNOSIS — R152 Fecal urgency: Secondary | ICD-10-CM

## 2021-02-22 DIAGNOSIS — R159 Full incontinence of feces: Secondary | ICD-10-CM

## 2021-02-22 DIAGNOSIS — R1319 Other dysphagia: Secondary | ICD-10-CM

## 2021-11-30 ENCOUNTER — Other Ambulatory Visit: Payer: Self-pay | Admitting: Gastroenterology

## 2021-11-30 DIAGNOSIS — K219 Gastro-esophageal reflux disease without esophagitis: Secondary | ICD-10-CM

## 2021-11-30 DIAGNOSIS — R1032 Left lower quadrant pain: Secondary | ICD-10-CM

## 2021-11-30 DIAGNOSIS — R152 Fecal urgency: Secondary | ICD-10-CM

## 2021-11-30 DIAGNOSIS — R1319 Other dysphagia: Secondary | ICD-10-CM

## 2022-03-22 ENCOUNTER — Other Ambulatory Visit: Payer: Self-pay | Admitting: Gastroenterology

## 2022-03-22 DIAGNOSIS — R1319 Other dysphagia: Secondary | ICD-10-CM

## 2022-03-22 DIAGNOSIS — K219 Gastro-esophageal reflux disease without esophagitis: Secondary | ICD-10-CM

## 2022-03-22 DIAGNOSIS — R1032 Left lower quadrant pain: Secondary | ICD-10-CM

## 2022-03-22 DIAGNOSIS — R159 Full incontinence of feces: Secondary | ICD-10-CM

## 2022-09-17 IMAGING — US US THYROID
1 series · 13 of 25 positions shown · non-contrast
Comparison: Prior thyroid ultrasound 06/30/2014

CLINICAL DATA: Goiter.

EXAM:
THYROID ULTRASOUND
TECHNIQUE: Ultrasound examination of the thyroid gland and adjacent soft
tissues was performed.

[Series 1: us thyroid · 0.03mm/px · 13 of 65 slices shown]
[im 1/65]
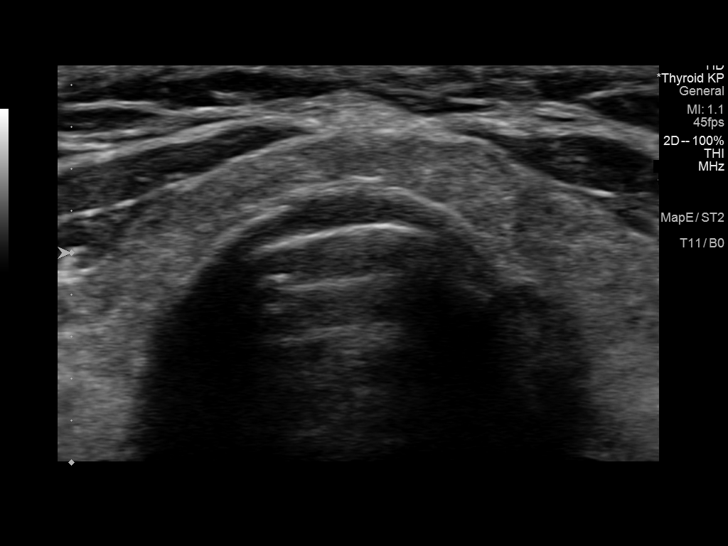
[im 6/65]
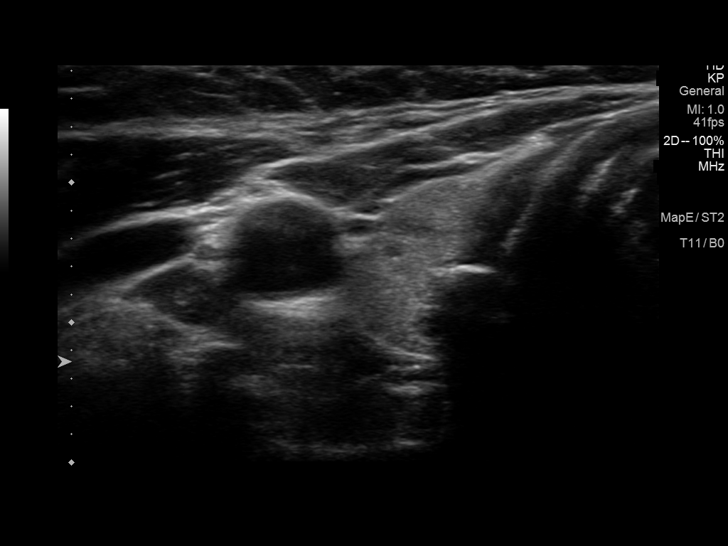
[im 11/65]
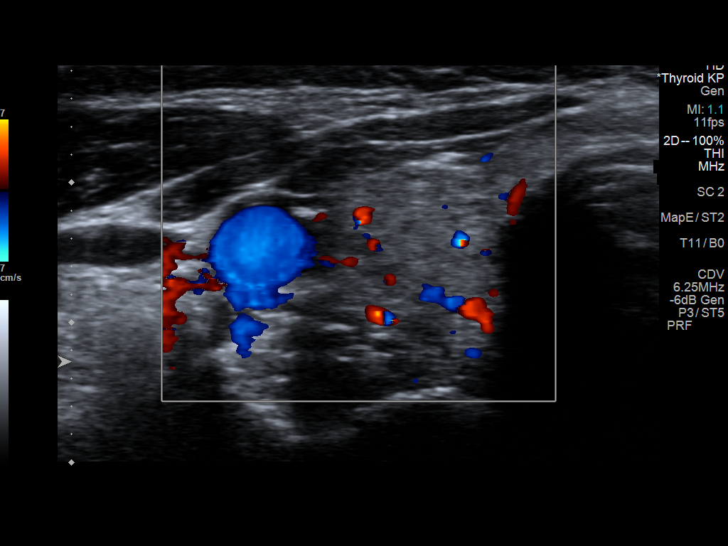
[im 17/65]
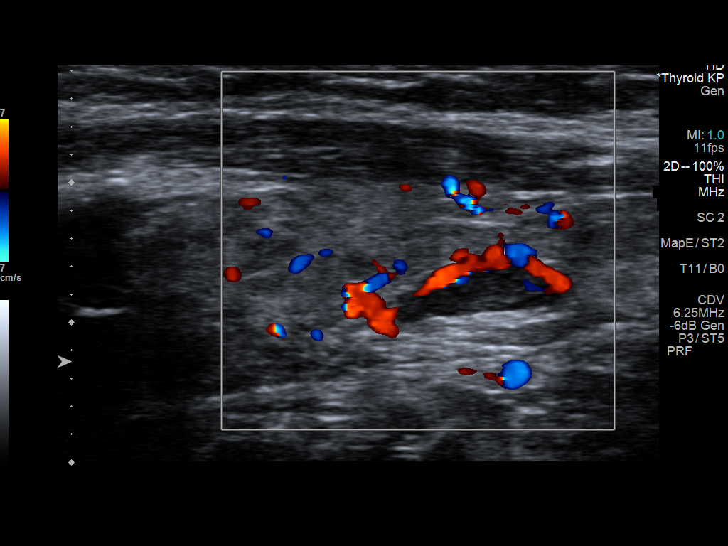
[im 22/65]
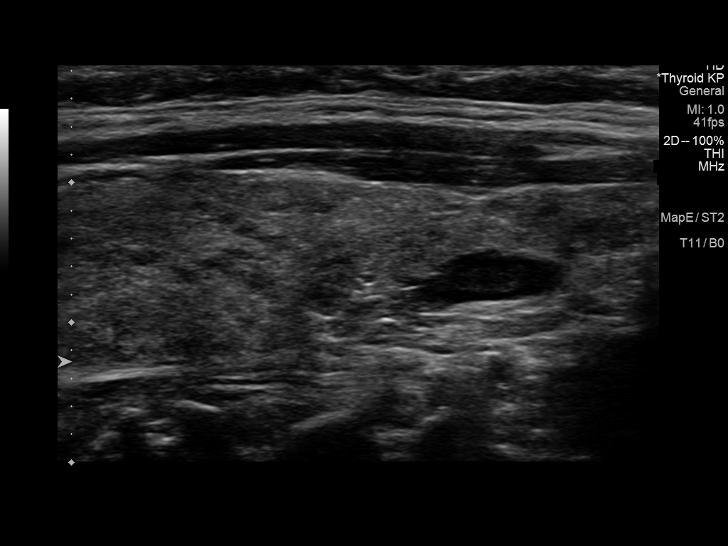
[im 27/65]
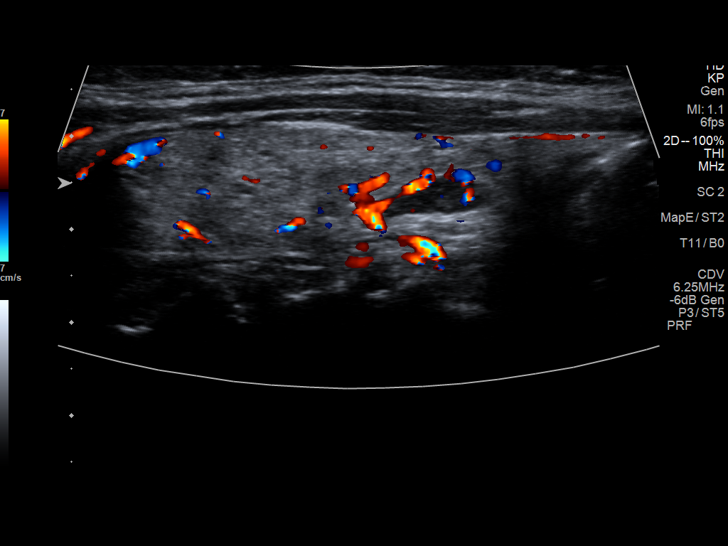
[im 33/65]
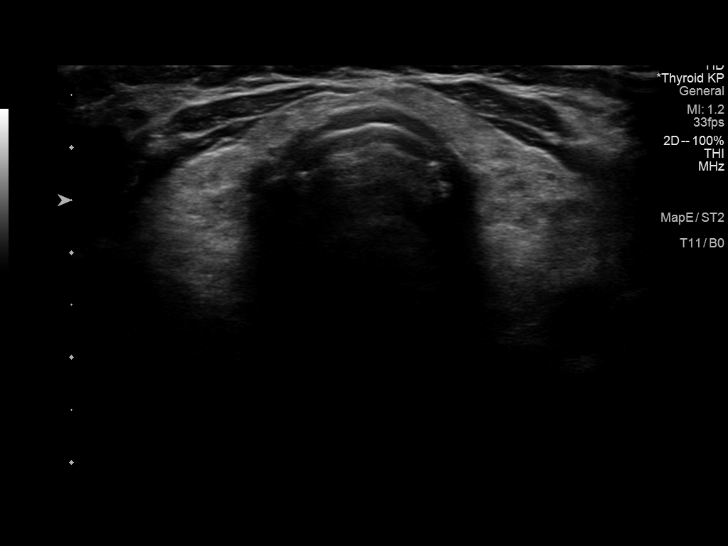
[im 38/65]
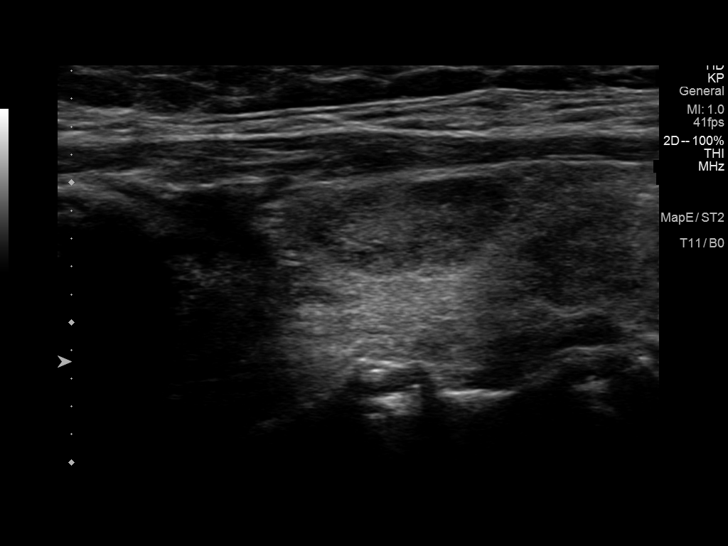
[im 43/65]
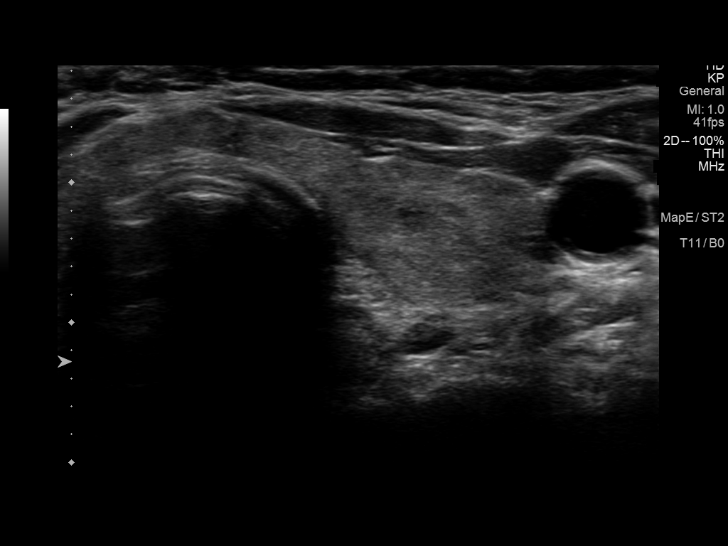
[im 49/65]
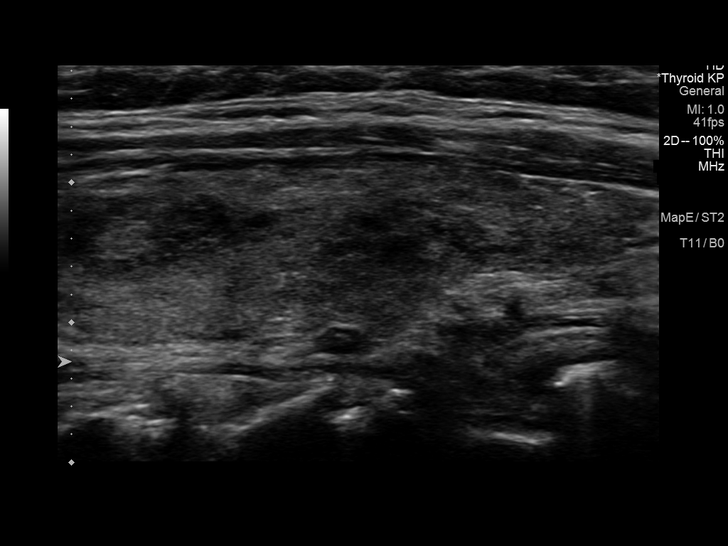
[im 54/65]
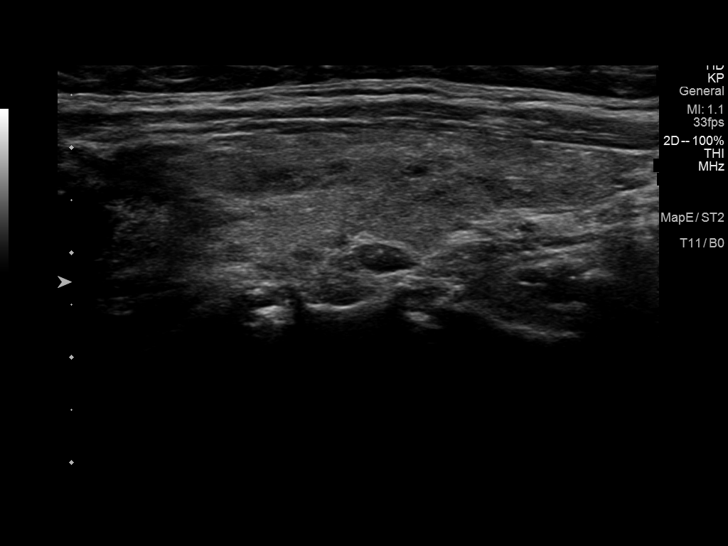
[im 59/65]
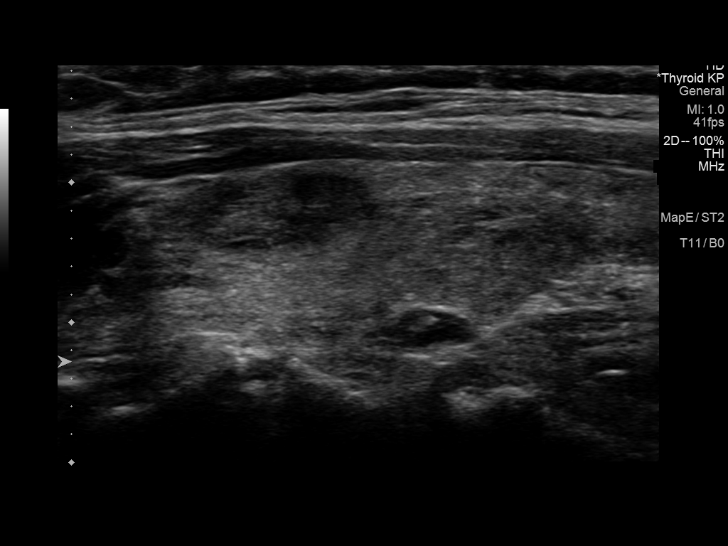
[im 65/65]
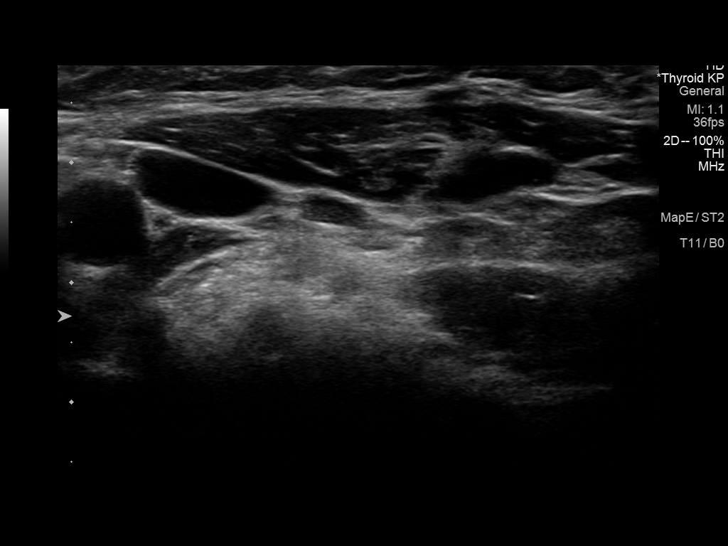

[13 of 25 positions shown; findings below may reference images not displayed]

FINDINGS: Parenchymal Echotexture: Moderately heterogenous

Isthmus: 0.4 cm

Right lobe: 6.0 x 1.3 x 1.4 cm

Left lobe: 5.0 x 1.2 x 1.6 cm

_________________________________________________________

Estimated total number of nodules >/= 1 cm: 2

Number of spongiform nodules >/=  2 cm not described below (TR1): 0

Number of mixed cystic and solid nodules >/= 1.5 cm not described
below (TR2): 0

_________________________________________________________

Nodule # 1: Ovoid hypoechoic nodule deep to the posterior aspect of
the lower pole of the right thyroid gland is essentially unchanged
at 1.3 x 0.6 x 0.4 cm compared to 1.1 x 0.5 x 0.4 cm previously.
Greater than 5 year stability is consistent with benignity.

Nodule # 2: Hypoechoic solid nodule in the upper pole of the left
thyroid gland remains unchanged at 1.5 x 0.8 x 0.7 cm compared to
1.6 x 1.0 x 0.7 cm previously. Greater than 5 year stability is
consistent with benignity.

No new or suspicious thyroid nodules identified.
IMPRESSION: Greater than 5 year stability of bilateral thyroid nodules
consistent with benignity. No further follow-up is required.

The above is in keeping with the ACR TI-RADS recommendations - [HOSPITAL] 4314;[DATE].

## 2023-09-01 ENCOUNTER — Ambulatory Visit: Admitting: Podiatry

## 2023-09-08 ENCOUNTER — Encounter: Payer: Self-pay | Admitting: Podiatry

## 2023-09-08 ENCOUNTER — Ambulatory Visit: Admitting: Podiatry

## 2023-09-08 DIAGNOSIS — R229 Localized swelling, mass and lump, unspecified: Secondary | ICD-10-CM

## 2023-09-08 DIAGNOSIS — M79675 Pain in left toe(s): Secondary | ICD-10-CM

## 2023-09-08 DIAGNOSIS — D492 Neoplasm of unspecified behavior of bone, soft tissue, and skin: Secondary | ICD-10-CM

## 2023-09-08 NOTE — Progress Notes (Unsigned)
 Chief Complaint  Patient presents with   Toe Pain    "I have a corn on my toe that has gotten pretty big.  It catches on to things, it needs to go." N - corn L - hallux left D - 2 years O - gradually worse C - gotten bigger A - catches on to things T - went to Dermatologist, she suggested I come here.   HPI: 68 y.o. female presents today with concern of a painful skin growth on the medial left great toe.  She is active with sports that involve a lot of side-to-side running/motion.  She is not sure if this is what caused this growth.  Attempts at shaving/filing the area at home cause some discomfort.  She would like to have this removed today if possible.  She has a history of malignant melanoma to the lower extremity.  Patient notes that she was referred here by her dermatologist  Past Medical History:  Diagnosis Date   DES exposure in utero    Diverticulitis    Diverticulosis    Hiatal hernia    IBS (irritable bowel syndrome)    Melanoma (HCC)    Microscopic hematuria 90's   w/u negative     Past Surgical History:  Procedure Laterality Date   MELANOMA EXCISION     TONSILLECTOMY AND ADENOIDECTOMY     TUBAL LIGATION  1990   No Known Allergies   Physical Exam: There are palpable pedal pulses left foot.  There is a moderate-sized skin growth along the medial aspect of the left hallux near the IPJ.  This is flesh-colored.  There is no surrounding erythema or edema.  There is almost a flap like presentation to the mass.  This measures approximately 1.2 cm x 0.4 cm.  There is pain on palpation of the mass.  Epicritic sensation is intact to the hallux.  Assessment/Plan of Care: 1. Mass of skin   2. Pain in left toe(s)   3. Skin neoplasm    Discussed clinical findings with patient today.  Discussed excision/excisional biopsy of the skin mass.  Informed her that this will be sent to pathology for identification since at this point in time it appears benign but due to her  history of melanoma and possible recurrent trauma to this portion of the toe from her sports, cannot be 100% certain as to the diagnosis today.  After discussing the risks and possible postprocedure complications, and obtaining verbal and written consent, the left hallux was anesthetized with a combination of 1% lidocaine plain and 0.5% Sensorcaine plain for a total of 3 cc administered to the left hallux.  A sterile skin prep was performed.  Following confirmation of anesthesia, a #15 blade was utilized to circumscribe the lesion on the medial aspect of the left hallux near the IPJ.  Dissection was carried down to the level of subcutaneous tissue.  Once the lesion was circumscribed it was removed in toto and placed in formalin container to be sent to pathology at [facility].  Some continued bleeding was noted so a pressure dressing was applied, utilizing a combination of pressure and cauterization of the base with a hand-held Bovie.  A final dressing of antibiotic ointment/Xeroform gauze and a dry sterile dressing was applied to the toe.  She will leave this on until tomorrow.  She may remove this at that time to begin bathing.  She can perform daily Epsom salt soaks.  Needs to apply antibiotic ointment  and a light gauze dressing to the area daily.  She may use a Telfa pad as the primary dressing layer.  The medical assistant provided some written postoperative instructions for her.  Patient tolerated the anesthesia procedure well and left the office in stable condition.  Follow-up in 1 to 2 weeks for recheck and review of pathology results   Zarian Colpitts DBurna Mortimer, DPM, FACFAS [facility]     [address] [place], [place] [address]                Office [phone]  Fax [phone]

## 2023-09-08 NOTE — Patient Instructions (Signed)

## 2023-09-10 DIAGNOSIS — D1800 Hemangioma unspecified site: Secondary | ICD-10-CM | POA: Insufficient documentation

## 2023-09-10 DIAGNOSIS — Z85820 Personal history of malignant melanoma of skin: Secondary | ICD-10-CM | POA: Insufficient documentation

## 2023-09-14 ENCOUNTER — Ambulatory Visit (INDEPENDENT_AMBULATORY_CARE_PROVIDER_SITE_OTHER): Admitting: Podiatry

## 2023-09-14 DIAGNOSIS — Z9889 Other specified postprocedural states: Secondary | ICD-10-CM

## 2023-09-14 NOTE — Progress Notes (Signed)
       Subjective:  Patient ID: Mary Bowers, female    DOB: 22-Dec-1955,  MRN: [id number]  Park Pope Agne presents to clinic today for:  Chief Complaint  Patient presents with   Mass of skin    Pt stated that it is doing better she stated that it still has some leakage    Patient is 1 week postop after undergoing excision of painful skin growth on medial left hallux..  Patient denies fever, chills, night sweats, nausea/vomiting.  Patient denies chest pain or shortness of breath.  Patient denies calf pain.  Patient notes there is minimal drainage from the toe at this time.  Past Medical History:  Diagnosis Date   DES exposure in utero    Diverticulitis    Diverticulosis    Hiatal hernia    IBS (irritable bowel syndrome)    Melanoma (HCC)    Microscopic hematuria 90's   w/u negative     Past Surgical History:  Procedure Laterality Date   MELANOMA EXCISION     TONSILLECTOMY AND ADENOIDECTOMY     TUBAL LIGATION  1990    No Known Allergies  Objective:  There were no vitals filed for this visit.  Physical Examination: There are palpable pedal pulses.  No edema or erythema is noted to the left great toe.  The excised area is granulating in well and is superficial at this time.  There is some eschar formation present.  The base is granular.  No clinical signs of infection noted  We do not yet have the pathology report back from the specimen.  Assessment/Plan: 1. Post-operative state     Patient to continue with antibiotic ointment to the area daily.  Patient stated that she has not been applying the ointment since it looks so good.  She was instructed that she could forego the ointment but keep covered with a Band-Aid as long as the area is draining.  Once the dry scab has formed, she can stop all treatment.  Will notify the patient once we receive the final pathology report.  Return if symptoms worsen or fail to improve.   Clerance Lav, DPM, FACFAS [facility]     [address] [place], [place] [address]                Office [phone]  Fax [phone]

## 2023-09-15 ENCOUNTER — Other Ambulatory Visit: Payer: Self-pay | Admitting: Podiatry

## 2023-09-16 ENCOUNTER — Encounter: Payer: Self-pay | Admitting: Podiatry
